# Patient Record
Sex: Female | Born: 1970
Health system: Southern US, Community
[De-identification: ages and names within clinical notes are randomized; demographics above are authoritative.]

## PROBLEM LIST (undated history)

## (undated) DIAGNOSIS — E079 Disorder of thyroid, unspecified: Secondary | ICD-10-CM

## (undated) DIAGNOSIS — E119 Type 2 diabetes mellitus without complications: Secondary | ICD-10-CM

## (undated) DIAGNOSIS — I10 Essential (primary) hypertension: Secondary | ICD-10-CM

## (undated) DIAGNOSIS — N83209 Unspecified ovarian cyst, unspecified side: Secondary | ICD-10-CM

## (undated) DIAGNOSIS — K219 Gastro-esophageal reflux disease without esophagitis: Secondary | ICD-10-CM

## (undated) HISTORY — PX: KNEE SURGERY: SHX244

## (undated) HISTORY — PX: CHOLECYSTECTOMY: SHX55

## (undated) HISTORY — PX: BREAST SURGERY: SHX581

## (undated) HISTORY — PX: THYROIDECTOMY: SHX17

## (undated) HISTORY — PX: ABDOMINAL HYSTERECTOMY: SHX81

## (undated) HISTORY — PX: ESOPHAGEAL DILATION: SHX303

---

## 2004-09-17 ENCOUNTER — Emergency Department (HOSPITAL_COMMUNITY): Admission: EM | Admit: 2004-09-17 | Discharge: 2004-09-17 | Payer: Self-pay | Admitting: Emergency Medicine

## 2008-04-22 ENCOUNTER — Emergency Department (HOSPITAL_COMMUNITY): Admission: EM | Admit: 2008-04-22 | Discharge: 2008-04-22 | Payer: Self-pay | Admitting: Emergency Medicine

## 2008-04-28 ENCOUNTER — Encounter (INDEPENDENT_AMBULATORY_CARE_PROVIDER_SITE_OTHER): Payer: Self-pay | Admitting: Obstetrics and Gynecology

## 2008-04-28 ENCOUNTER — Ambulatory Visit (HOSPITAL_COMMUNITY): Admission: AD | Admit: 2008-04-28 | Discharge: 2008-04-28 | Payer: Self-pay | Admitting: Obstetrics and Gynecology

## 2008-07-08 ENCOUNTER — Emergency Department (HOSPITAL_BASED_OUTPATIENT_CLINIC_OR_DEPARTMENT_OTHER): Admission: EM | Admit: 2008-07-08 | Discharge: 2008-07-08 | Payer: Self-pay | Admitting: Emergency Medicine

## 2008-08-31 ENCOUNTER — Emergency Department (HOSPITAL_BASED_OUTPATIENT_CLINIC_OR_DEPARTMENT_OTHER): Admission: EM | Admit: 2008-08-31 | Discharge: 2008-08-31 | Payer: Self-pay | Admitting: Emergency Medicine

## 2008-10-17 ENCOUNTER — Emergency Department (HOSPITAL_BASED_OUTPATIENT_CLINIC_OR_DEPARTMENT_OTHER): Admission: EM | Admit: 2008-10-17 | Discharge: 2008-10-17 | Payer: Self-pay | Admitting: Emergency Medicine

## 2008-10-23 ENCOUNTER — Emergency Department (HOSPITAL_BASED_OUTPATIENT_CLINIC_OR_DEPARTMENT_OTHER): Admission: EM | Admit: 2008-10-23 | Discharge: 2008-10-23 | Payer: Self-pay | Admitting: Emergency Medicine

## 2008-12-19 ENCOUNTER — Ambulatory Visit: Payer: Self-pay | Admitting: Diagnostic Radiology

## 2008-12-19 ENCOUNTER — Emergency Department (HOSPITAL_BASED_OUTPATIENT_CLINIC_OR_DEPARTMENT_OTHER): Admission: EM | Admit: 2008-12-19 | Discharge: 2008-12-19 | Payer: Self-pay | Admitting: Emergency Medicine

## 2009-04-12 ENCOUNTER — Inpatient Hospital Stay (HOSPITAL_COMMUNITY): Admission: AD | Admit: 2009-04-12 | Discharge: 2009-04-12 | Payer: Self-pay | Admitting: Obstetrics and Gynecology

## 2009-05-15 ENCOUNTER — Inpatient Hospital Stay (HOSPITAL_COMMUNITY): Admission: AD | Admit: 2009-05-15 | Discharge: 2009-05-15 | Payer: Self-pay | Admitting: Obstetrics and Gynecology

## 2009-06-28 ENCOUNTER — Ambulatory Visit (HOSPITAL_COMMUNITY): Admission: RE | Admit: 2009-06-28 | Discharge: 2009-06-29 | Payer: Self-pay | Admitting: Obstetrics and Gynecology

## 2009-06-28 ENCOUNTER — Encounter (INDEPENDENT_AMBULATORY_CARE_PROVIDER_SITE_OTHER): Payer: Self-pay | Admitting: Obstetrics and Gynecology

## 2009-07-12 ENCOUNTER — Inpatient Hospital Stay (HOSPITAL_COMMUNITY): Admission: AD | Admit: 2009-07-12 | Discharge: 2009-07-13 | Payer: Self-pay | Admitting: Obstetrics and Gynecology

## 2009-09-12 ENCOUNTER — Emergency Department (HOSPITAL_BASED_OUTPATIENT_CLINIC_OR_DEPARTMENT_OTHER): Admission: EM | Admit: 2009-09-12 | Discharge: 2009-09-12 | Payer: Self-pay | Admitting: Emergency Medicine

## 2009-12-08 ENCOUNTER — Observation Stay (HOSPITAL_COMMUNITY): Admission: EM | Admit: 2009-12-08 | Discharge: 2009-12-08 | Payer: Self-pay | Admitting: Emergency Medicine

## 2009-12-30 ENCOUNTER — Encounter: Payer: Self-pay | Admitting: Emergency Medicine

## 2009-12-30 ENCOUNTER — Ambulatory Visit: Payer: Self-pay | Admitting: Diagnostic Radiology

## 2009-12-30 ENCOUNTER — Inpatient Hospital Stay (HOSPITAL_COMMUNITY): Admission: AD | Admit: 2009-12-30 | Discharge: 2009-12-30 | Payer: Self-pay | Admitting: Obstetrics and Gynecology

## 2010-01-28 ENCOUNTER — Inpatient Hospital Stay (HOSPITAL_COMMUNITY): Admission: AD | Admit: 2010-01-28 | Discharge: 2010-01-28 | Payer: Self-pay | Admitting: Obstetrics and Gynecology

## 2010-02-01 ENCOUNTER — Observation Stay (HOSPITAL_COMMUNITY): Admission: AD | Admit: 2010-02-01 | Discharge: 2010-02-03 | Payer: Self-pay | Admitting: Internal Medicine

## 2010-02-26 ENCOUNTER — Ambulatory Visit (HOSPITAL_COMMUNITY): Admission: RE | Admit: 2010-02-26 | Discharge: 2010-02-26 | Payer: Self-pay | Admitting: Obstetrics and Gynecology

## 2011-03-09 LAB — CBC
MCV: 92.9 fL (ref 78.0–100.0)
RBC: 4.2 MIL/uL (ref 3.87–5.11)
WBC: 10.2 10*3/uL (ref 4.0–10.5)

## 2011-03-09 LAB — HEPATIC FUNCTION PANEL
Albumin: 4.3 g/dL (ref 3.5–5.2)
Alkaline Phosphatase: 50 U/L (ref 39–117)
Bilirubin, Direct: 0 mg/dL (ref 0.0–0.3)
Indirect Bilirubin: 0.9 mg/dL (ref 0.3–0.9)
Total Bilirubin: 0.9 mg/dL (ref 0.3–1.2)

## 2011-03-09 LAB — URINALYSIS, ROUTINE W REFLEX MICROSCOPIC
Glucose, UA: NEGATIVE mg/dL
Ketones, ur: NEGATIVE mg/dL
Nitrite: NEGATIVE
Protein, ur: NEGATIVE mg/dL
Urobilinogen, UA: 1 mg/dL (ref 0.0–1.0)
pH: 7 (ref 5.0–8.0)

## 2011-03-09 LAB — BASIC METABOLIC PANEL
Chloride: 104 mEq/L (ref 96–112)
Creatinine, Ser: 0.9 mg/dL (ref 0.4–1.2)
GFR calc Af Amer: >60 mL/min (ref >60)
Sodium: 138 mEq/L (ref 135–145)

## 2011-03-09 LAB — DIFFERENTIAL
Lymphs Abs: 3.4 10*3/uL (ref 0.7–4.0)
Monocytes Relative: 6 % (ref 3–12)
Neutro Abs: 5.9 10*3/uL (ref 1.7–7.7)
Neutrophils Relative %: 58 % (ref 43–77)

## 2011-03-09 LAB — LIPASE, BLOOD: Lipase: 37 U/L (ref 23–300)

## 2011-03-09 LAB — D-DIMER, QUANTITATIVE: D-Dimer, Quant: 0.31 ug/mL-FEU (ref 0.00–0.48)

## 2011-03-12 LAB — URINALYSIS, ROUTINE W REFLEX MICROSCOPIC
Bilirubin Urine: NEGATIVE
Hgb urine dipstick: NEGATIVE
Ketones, ur: NEGATIVE mg/dL
Nitrite: NEGATIVE
Specific Gravity, Urine: <1.005 — ABNORMAL LOW (ref 1.005–1.030)
Urobilinogen, UA: 0.2 mg/dL (ref 0.0–1.0)

## 2011-03-12 LAB — CBC
MCHC: 32.7 g/dL (ref 30.0–36.0)
MCV: 94.2 fL (ref 78.0–100.0)
RBC: 4.43 MIL/uL (ref 3.87–5.11)
RDW: 13.9 % (ref 11.5–15.5)

## 2011-03-12 LAB — DIFFERENTIAL
Basophils Absolute: 0 10*3/uL (ref 0.0–0.1)
Basophils Relative: 1 % (ref 0–1)
Eosinophils Absolute: 0 10*3/uL (ref 0.0–0.7)
Monocytes Absolute: 0.5 10*3/uL (ref 0.1–1.0)
Monocytes Relative: 5 % (ref 3–12)
Neutrophils Relative %: 64 % (ref 43–77)

## 2011-03-13 LAB — COMPREHENSIVE METABOLIC PANEL
AST: 94 U/L — ABNORMAL HIGH (ref 0–37)
Albumin: 3.5 g/dL (ref 3.5–5.2)
Albumin: 3.8 g/dL (ref 3.5–5.2)
Alkaline Phosphatase: 45 U/L (ref 39–117)
BUN: 10 mg/dL (ref 6–23)
CO2: 19 mEq/L (ref 19–32)
Calcium: 8.4 mg/dL (ref 8.4–10.5)
Chloride: 110 mEq/L (ref 96–112)
Creatinine, Ser: 1.01 mg/dL (ref 0.4–1.2)
GFR calc Af Amer: >60 mL/min (ref >60)
GFR calc non Af Amer: >60 mL/min (ref >60)
Glucose, Bld: 171 mg/dL — ABNORMAL HIGH (ref 70–99)
Potassium: 4.1 mEq/L (ref 3.5–5.1)
Total Bilirubin: 0.4 mg/dL (ref 0.3–1.2)
Total Protein: 6.3 g/dL (ref 6.0–8.3)

## 2011-03-13 LAB — URINALYSIS, ROUTINE W REFLEX MICROSCOPIC
Bilirubin Urine: NEGATIVE
Hgb urine dipstick: NEGATIVE
Nitrite: NEGATIVE
Protein, ur: NEGATIVE mg/dL
Urobilinogen, UA: 0.2 mg/dL (ref 0.0–1.0)

## 2011-03-13 LAB — CBC
MCHC: 33.3 g/dL (ref 30.0–36.0)
MCV: 93.7 fL (ref 78.0–100.0)
Platelets: 282 10*3/uL (ref 150–400)

## 2011-03-16 LAB — CBC
HCT: 39.1 % (ref 36.0–46.0)
MCHC: 33.4 g/dL (ref 30.0–36.0)
MCV: 94 fL (ref 78.0–100.0)
Platelets: 310 10*3/uL (ref 150–400)

## 2011-03-24 LAB — COMPREHENSIVE METABOLIC PANEL
AST: 164 U/L — ABNORMAL HIGH (ref 0–37)
Albumin: 4 g/dL (ref 3.5–5.2)
BUN: 2 mg/dL — ABNORMAL LOW (ref 6–23)
Chloride: 102 mEq/L (ref 96–112)
Creatinine, Ser: 0.96 mg/dL (ref 0.4–1.2)
GFR calc Af Amer: >60 mL/min (ref >60)
Potassium: 3.9 mEq/L (ref 3.5–5.1)
Total Bilirubin: 0.5 mg/dL (ref 0.3–1.2)
Total Protein: 6.7 g/dL (ref 6.0–8.3)

## 2011-03-24 LAB — DIFFERENTIAL
Eosinophils Relative: 0 % (ref 0–5)
Lymphocytes Relative: 8 % — ABNORMAL LOW (ref 12–46)
Monocytes Absolute: 0.5 10*3/uL (ref 0.1–1.0)
Monocytes Relative: 7 % (ref 3–12)
Neutro Abs: 6.2 10*3/uL (ref 1.7–7.7)

## 2011-03-24 LAB — CBC
HCT: 39.7 % (ref 36.0–46.0)
MCV: 93.2 fL (ref 78.0–100.0)
Platelets: 245 10*3/uL (ref 150–400)
RDW: 13.7 % (ref 11.5–15.5)
WBC: 7.4 10*3/uL (ref 4.0–10.5)

## 2011-03-24 LAB — HEPATIC FUNCTION PANEL
ALT: 174 U/L — ABNORMAL HIGH (ref 0–35)
AST: 152 U/L — ABNORMAL HIGH (ref 0–37)
Alkaline Phosphatase: 72 U/L (ref 39–117)
Bilirubin, Direct: <0.1 mg/dL (ref 0.0–0.3)
Total Bilirubin: 0.4 mg/dL (ref 0.3–1.2)

## 2011-03-28 LAB — URINALYSIS, ROUTINE W REFLEX MICROSCOPIC
Bilirubin Urine: NEGATIVE
Hgb urine dipstick: NEGATIVE
Ketones, ur: NEGATIVE mg/dL
Nitrite: NEGATIVE
Protein, ur: NEGATIVE mg/dL
Urobilinogen, UA: 1 mg/dL (ref 0.0–1.0)

## 2011-03-28 LAB — URINE MICROSCOPIC-ADD ON

## 2011-03-30 LAB — URINALYSIS, ROUTINE W REFLEX MICROSCOPIC
Glucose, UA: NEGATIVE mg/dL
Protein, ur: NEGATIVE mg/dL
Specific Gravity, Urine: 1.02 (ref 1.005–1.030)
pH: 6.5 (ref 5.0–8.0)

## 2011-03-30 LAB — COMPREHENSIVE METABOLIC PANEL
ALT: 23 U/L (ref 0–35)
Alkaline Phosphatase: 48 U/L (ref 39–117)
BUN: 10 mg/dL (ref 6–23)
CO2: 24 mEq/L (ref 19–32)
GFR calc non Af Amer: >60 mL/min (ref >60)
Glucose, Bld: 122 mg/dL — ABNORMAL HIGH (ref 70–99)
Potassium: 3.9 mEq/L (ref 3.5–5.1)
Sodium: 138 mEq/L (ref 135–145)

## 2011-03-30 LAB — CBC
HCT: 34.9 % — ABNORMAL LOW (ref 36.0–46.0)
Hemoglobin: 12.2 g/dL (ref 12.0–15.0)
Hemoglobin: 11.6 g/dL — ABNORMAL LOW (ref 12.0–15.0)
MCHC: 34.9 g/dL (ref 30.0–36.0)
Platelets: 264 10*3/uL (ref 150–400)
RBC: 3.78 MIL/uL — ABNORMAL LOW (ref 3.87–5.11)
RBC: 3.6 MIL/uL — ABNORMAL LOW (ref 3.87–5.11)
WBC: 11.1 10*3/uL — ABNORMAL HIGH (ref 4.0–10.5)
WBC: 13.6 10*3/uL — ABNORMAL HIGH (ref 4.0–10.5)

## 2011-03-30 LAB — URINE MICROSCOPIC-ADD ON

## 2011-03-30 LAB — HCG, SERUM, QUALITATIVE: Preg, Serum: NEGATIVE

## 2011-03-30 LAB — URINE CULTURE: Colony Count: 70000

## 2011-04-01 LAB — CBC
HCT: 40.4 % (ref 36.0–46.0)
Hemoglobin: 13.7 g/dL (ref 12.0–15.0)
MCV: 94.4 fL (ref 78.0–100.0)
RBC: 4.28 MIL/uL (ref 3.87–5.11)
WBC: 8.9 10*3/uL (ref 4.0–10.5)

## 2011-04-01 LAB — COMPREHENSIVE METABOLIC PANEL
AST: 23 U/L (ref 0–37)
BUN: 7 mg/dL (ref 6–23)
CO2: 25 mEq/L (ref 19–32)
Chloride: 105 mEq/L (ref 96–112)
Creatinine, Ser: 0.83 mg/dL (ref 0.4–1.2)
GFR calc Af Amer: >60 mL/min (ref >60)
GFR calc non Af Amer: >60 mL/min (ref >60)
Total Bilirubin: 0.8 mg/dL (ref 0.3–1.2)

## 2011-04-01 LAB — DIFFERENTIAL
Basophils Absolute: 0 10*3/uL (ref 0.0–0.1)
Eosinophils Relative: 0 % (ref 0–5)
Lymphocytes Relative: 18 % (ref 12–46)

## 2011-04-01 LAB — LIPASE, BLOOD: Lipase: 17 U/L (ref 11–59)

## 2011-04-02 LAB — URINALYSIS, ROUTINE W REFLEX MICROSCOPIC
Nitrite: NEGATIVE
Specific Gravity, Urine: 1.025 (ref 1.005–1.030)
pH: 5.5 (ref 5.0–8.0)

## 2011-04-02 LAB — DIFFERENTIAL
Basophils Absolute: 0.1 10*3/uL (ref 0.0–0.1)
Lymphocytes Relative: 41 % (ref 12–46)
Monocytes Absolute: 0.4 10*3/uL (ref 0.1–1.0)
Neutro Abs: 4.6 10*3/uL (ref 1.7–7.7)
Neutrophils Relative %: 53 % (ref 43–77)

## 2011-04-02 LAB — CBC
Hemoglobin: 13.5 g/dL (ref 12.0–15.0)
Platelets: 321 10*3/uL (ref 150–400)
RDW: 13.3 % (ref 11.5–15.5)

## 2011-05-06 NOTE — Op Note (Signed)
[name]                ACCOUNT NO.:  [id number]   MEDICAL RECORD NO.:  [id number]          PATIENT TYPE:  AMB   LOCATION:  SDC                           F[facility]ILITY:  [facility]   PHYSICIAN:  Osborn Coho, M.D.   DATE OF BIRTH:  09/02/1971   DATE OF PROCEDURE:  04/28/2008  DATE OF DISCHARGE:                               OPERATIVE REPORT   PREOPERATIVE DIAGNOSES:  1. Right ovarian cyst.  2. Ovarian torsion.   POSTOPERATIVE DIAGNOSES:  1. Right ovarian cyst.  2. Ovarian torsion.   PROCEDURE:  Laparoscopic right cystectomy.   ATTENDING PHYSICIAN:  Osborn Coho, MD   ANESTHESIA:  General via endotracheal tube.   FINDINGS:  Large approximately 7 cm right ovarian cyst.   SPECIMENS TO PATHOLOGY:  Right cyst wall and right cyst fluid.   FLUIDS:  1500 mL.   URINE OUTPUT:  30 mL.   ESTIMATED BLOOD LOSS:  Minimal.   COMPLICATIONS:  None.   PROCEDURE IN DETAIL:  The patient was taken to the operating room after  the risks, benefits, and alternatives discussed with the patient.  The  patient verbalized understanding and consent signed and witness.  The  patient was placed under general anesthesia and prepped and draped in  the normal sterile fashion in the dorsal lithotomy position.  A bivalved  speculum was placed in the patient's vagina and a Hulka tenaculum placed  for intrauterine manipulation.  The speculum was removed and attention  was then turned to the abdomen after regloving and gowning.  A 10-mm  incision was made at the umbilicus and Veress needle introduced into the  intra-abdominal cavity and pneumoperitoneum achieved.  The Veress needle  was removed and 10-mm trocar advanced to the intra-abdominal cavity and  laparoscope introduced as well.  Small amount of adhesions over the  liver edge and an adhesion in the left lower quadrant.  Cyst noted as  above.  A 5-mm incision was made in the left lower quadrant and 5-mm  trocar advanced under direct visualization.   0.5% Marcaine was injected  at all incision sites.  A 5-mm incision was also made in the suprapubic  region and 5-mm trocar advanced under direct visualization.  The cyst  was then placed above the uterus in the anterior cul-de-sac and using  the needle point gyrus, an incision was made over the cyst and the cyst  wall was shelled out.  The cyst was ruptured and fluid collected and  sent to pathology.  The cyst wall was easily pulled away from the normal  ovary and was sent to pathology.  The bed of the ovary was then made  hemostatic with the bipolar cautery.  Good hemostasis was noted.  Copious irrigation was performed.  The left ovary and fallopian tube  appeared to be within normal limits.  The pneumoperitoneum was then  relieved as the 5-mm trocar in the suprapubic region and in left lower  quadrant were removed under direct visualization.  The 10-mm trocar at  the umbilicus was then removed under direct visualization.  The 10-mm  incision at the umbilicus  was repaired on the fascia using 0 Vicryl via  a running stitch and the skin was reapproximated using 3-0 Monocryl via  a subcuticular stitch.  There was some bleeding noted at the left lower  quadrant incision which was made hemostatic by holding pressure.  The  two 5-mm incisions were repaired with Dermabond.  The Hulka tenaculum  was removed.  Sponge, lap, and needle count was correct.  The patient  tolerated the procedure well and is awaiting transfer to the recovery  room in good condition.      Osborn Coho, M.D.  Electronically Signed     AR/MEDQ  D:  04/28/2008  T:  04/29/2008  Job:  [id number]

## 2011-05-06 NOTE — Consult Note (Signed)
NAMEAZENETH, CARBONELL NO.:  [id number]   MEDICAL RECORD NO.:  [id number]          PATIENT TYPE:  MAT   LOCATION:  MATC                          [facility]:  [facility]   PHYSICIAN:  Janine Limbo, M.D.DATE OF BIRTH:  [birth year]   DATE OF CONSULTATION:  07/13/2009  DATE OF DISCHARGE:  07/13/2009                                 CONSULTATION   HISTORY OF PRESENT ILLNESS:  Ms. Forgione a is a 40 year old female, para  2-0-0-2, who presents to the Maternity Admissions Area at the [facility] complaining of abdominal pain.  She rates her  abdominal pain as a 9/10.  The patient had a laparoscopy-assisted  vaginal hysterectomy on June 28, 2009.  Her preoperative diagnosis was  dysmenorrhea and menorrhagia.  The patient also had cystoscopy  performed.  Operative findings included adhesions between the omentum  and the anterior abdominal wall.  The ovaries and the fallopian tubes  were normal bilaterally except for defects from a prior tubal ligation.  No pathology was noted on the uterus.  On cystoscopy, no pathology was  noted inside the bladder.  Dye easily passed through both ureteral  orifices.  The patient was discharged to home without complications in  her postoperative stay.  The patient reports tonight that her pain never  improved much with pain medicine.  Even though at one point she says she  was allergic to DILAUDID, she has been taking Dilaudid at home.  She  reports that today her pain got worse than it normally has been.  The  patient did not have a bowel movement today, but had did have a bowel  movement yesterday.  She denies rectal bleeding.  She reports that she  has had some dysuria.  The patient reports that she had pain prior to  her surgery.  She actually rated her pain prior to her surgery as 10/10.  The patient has been followed at the [facility] and  Gynecology Division of [facility].  The patient  has  had a prior tubal ligation and she also had a cesarean section.   DRUG ALLERGIES:  The patient reports me in allergic to DARVOCET,  PERCOCET, and ZOFRAN.  She says she is allergic to DILAUDID, but this  causes chest pain.   PAST MEDICAL HISTORY:  The patient has a history of gastroesophageal  reflux disease, irritable bowel syndrome, migraine headaches, ovarian  torsion, and abnormal thyroid testing.  She had a cholecystectomy done  in 2000.  She had a right breast biopsy performed in 2000.  The patient  had a laparoscopic right ovarian cystectomy in 2009.  She had a meniscus  repair in her left knee in 2004.   SOCIAL HISTORY:  The patient drinks alcohol socially.  She denies  tobacco use and other recreational drug uses.   REVIEW OF SYSTEMS:  Please see history of present illness.   FAMILY HISTORY:  Noncontributory.   PHYSICAL EXAMINATION:  VITAL SIGNS:  Blood pressure is 122/59, pulse is  91, respirations 20, temperature is 98.6.  HEENT:  Within normal limits.  The patient expresses that her pain is  9/10; however, she is in no distress when you do her abdominal exam.  CHEST:  Clear.  HEART:  Regular rate and rhythm.  BACK:  Without CVA tenderness.  ABDOMEN:  Appropriately tender, although she says her pain is 9/10.  Her  incisions are well healed.  PELVIC:  External genitalia is normal.  The vagina is normal.  The  bladder is nontender to exam.  The rectum is nontender to exam.  The  cervix is absent.  Uterus is absent.  No adnexal masses are appreciated.   LABORATORY DATA:  White blood cell count is 9100, hematocrit is 34.9%  hemoglobin is 12.2, platelet count is 328,000.  Urinalysis is normal  except for large amount of hemoglobin in the urine and a large amount of  leukocyte Estrace.   ASSESSMENT:  1. Abdominal pain of uncertain etiology.  2. Status post laparoscopy-assisted vaginal hysterectomy and      cystectomy on June 28, 2009.  3. History of irritable bowel  syndrome.  4. Possible urinary tract infection.   PLAN:  We will obtain an ultrasound of the pelvis while the patient is  here in the maternity admissions.  We will give her 60 mg of Toradol  intramuscularly and 25 mg of Phenergan intramuscularly.  We will  discharge the patient to home if no pathology is appreciated.  We will  send her urine for culture.  We will treat the patient with Septra DS  one tablet twice a day for 7 days.  Please make a copy of this note per  Dr. Osborn Coho at the [facility] and Gynecology  Division of [facility].      Janine Limbo, M.D.  Electronically Signed     AVS/MEDQ  D:  07/13/2009  T:  07/13/2009  Job:  [id number]

## 2011-05-06 NOTE — Op Note (Signed)
[name], [name]                ACCOUNT NO.:  [id number]   MEDICAL RECORD NO.:  [id number]          PATIENT TYPE:  OIB   LOCATION:  9317                          [facility]ACILITY:  [facility]   PHYSICIAN:  Osborn Coho, M.D.   DATE OF BIRTH:  Mar 28, 1971   DATE OF PROCEDURE:  06/28/2009  DATE OF DISCHARGE:                               OPERATIVE REPORT   PREOPERATIVE DIAGNOSES:  1. Dysmenorrhea.  2. Menorrhagia.   POSTOPERATIVE DIAGNOSES:  1. Dysmenorrhea.  2. Menorrhagia.  3. Adhesions.   PROCEDURE:  1. Laparoscopically-assisted vaginal hysterectomy.  2. Cystoscopy.   ATTENDING DOCTOR:  Osborn Coho, MD   ASSISTANT:  Marquis Lunch. Lowell Guitar, [facility]   ANESTHESIA:  General.   SPECIMENS TO PATHOLOGY:  Uterus and cervix weighing 145 g.   FLUIDS:  3700 mL.   URINE OUTPUT:  800 mL.   ESTIMATED BLOOD LOSS:  200 mL.   COMPLICATIONS:  None.   PROCEDURE:  The patient was taken to the operating room after the risks,  benefits, and alternatives discussed with the patient.  The patient  verbalized understanding, consent signed, and witnessed.  The patient  was placed under general per anesthesia and prepped and draped in the  normal sterile fashion in the dorsal lithotomy position.  A 0.25%  Marcaine was injected at the site of the umbilicus using a total of  approximately 10 mL.  A 10-mm incision was made at the umbilicus and the  Veress needle introduced into the intra-abdominal cavity and  pneumoperitoneum achieved.  The 10-mm trocar was then advanced into the  intra-abdominal cavity and laparoscope introduced.  Adhesions noted in  the upper abdominal cavity of the omentum to the anterior abdominal  wall.  The patient was placed in Trendelenburg.  Attention was then  turned to left lower quadrant, where a 5-mm incision was made and 5-mm  trocar advanced under direct visualization.  The same was done in the  right lower quadrant.  The bilateral ovaries were noted to be within  normal limits  and the bilateral fallopian tubes were noted to be within  normal limits as well with the history of the tubal ligation.  Attention  was then turned to the left round ligament which was cauterized and cut  and bladder flap created on that side.  The fallopian tube and utero-  ovarian were cauterized and cut down to the level of the incision on the  round ligament.  The same was done on the contralateral side.  I then  went below and a weighted speculum was placed in the patient's vagina  and the Deaver retractors were placed for vaginal wall retraction.  The  cervix was injected with dilute Pitressin and the cervix was  circumscribed with the Bovie.  The bladder was pushed away and the  anterior cul-de-sac entered without difficulty.  The posterior cul-de-  sac was then entered without difficulty as well.  The curved Heaney was  used to clamp, cut, and suture-ligated with 0 Vicryl of the uterosacrals  bilaterally.  In a bilateral sequential fashion, the paracervical tissue  and cord corporating  the cardinal ligaments was then clamped, cut, and  suture-ligated with 0 Vicryl.  This was done on the parametrial tissue  bilaterally and sequentially up to the level of where the utero-ovarian  pedicles were taken down and the round ligament was incised from above.  The uterus was then removed without difficulty and a free needle was  used to ligate the angles while pulling the suture through from the  angle stitches.  The McCall culdoplasty stitch was placed and the  vaginal cuff was repaired with 0 Vicryl incorporating the peritoneum.  The cuff was repaired with 0 Vicryl via figure-of-eight stitches.  There  was good hemostasis noted and the McCall culdoplasty stitch was tied.  I  then removed my outer gloves and went up above once again and  laparoscope introduced and the ovarian pedicles were noted bilaterally  to be hemostatic.  There was very small amount of oozing at the midline  of the  vaginal cuff and Gelfoam was placed for hemostasis.  Pneumoperitoneum was relieved for at least a minute and pneumoperitoneum  was achieved once again and the vaginal cuff was reinspected and good  hemostasis was noted.  The bilateral left and right lower quadrant  trocars were removed under direct visualization and pneumoperitoneum was  relieved and umbilical trocar removed under direct visualization.  The  fascia was repaired with 0 Vicryl via figure-of-eight stitch at the  umbilicus and the skin was reapproximated using 3-0 Monocryl via a  subcuticular stitch.  Dermabond was placed on the 5-mm left and right  lower quadrant ports.  Indigo carmine was administered and cystoscopy  was performed after prepping with Betadine and the bilateral ureters  were noted to efflux the indigo carmine without difficulty.  Sponge,  lap, and needle count was correct.  The patient tolerated the procedure  well and was returned to the recovery room in good condition.      Osborn Coho, M.D.  Electronically Signed     AR/MEDQ  D:  06/28/2009  T:  06/28/2009  Job:  [id number]

## 2011-05-06 NOTE — H&P (Signed)
[name], GROUND NO.:  [id number]   MEDICAL RECORD NO.:  [id number]          PATIENT TYPE:  AMB   LOCATION:                                [facility]   PHYSICIAN:  Osborn Coho, M.D.   DATE OF BIRTH:  02-Jul-1971   DATE OF ADMISSION:  06/28/2009  DATE OF DISCHARGE:                              HISTORY & PHYSICAL   HISTORY OF PRESENT ILLNESS:  [name] is a 40 year old married African  American female para 2-0-0-2 presenting for laparoscopically-assisted  vaginal hysterectomy because of menorrhagia and dysmenorrhea.  For the  past year and a half, the patient reports that her menstrual periods  have become increasingly more frequent, heavier, and painful.  She  reports twice monthly bleeding, lasting 5-7 days each episode during  which time she changes her pad every 2-4 hours.  She also admits to  clotting and will cramp as much as 1 week before she begins her flow and  rating that pain as a 10/10 on a 10-point pain scale.  The patient has  no relief from taking Tylenol or Vicodin for this pain and occasionally  will have decrease from 10/10 on a 10-point pain scale to 9/10 on a 10-  point pain scale with ibuprofen 600 mg.  It is interesting to note that  the patient states that when she does take her Tylenol that her pain  actually increases.  She admits to occasional constipation, but denies  any dyspareunia, urinary tract symptoms, vaginitis symptoms, or fever.  In May 2010, the patient presented with complaints of severe pelvic pain  and was evaluated with the findings of a normal comprehensive metabolic  panel, amylase, lipase, and CBC.  The patient underwent a CT scan of her  abdomen and pelvis, which also returned normal.  A pelvic ultrasound at  that same time showed a uterus measuring 4.79 x 8.93 x 5.67 cm with the  right ovary measuring 1.80 x 2.96 x 3.17 cm and the left ovary measuring  1.81 x 8.96 x 2.72 cm.  The patient's endometrium  appeared normal on  that study.  After careful consideration of both medical and surgical  management options for her symptoms, the patient has decided to proceed  with definitive therapy in the form of hysterectomy.   PAST MEDICAL HISTORY:   OBSTETRICS HISTORY:  Gravida 2, para 2-0-0-2.  The patient had one  spontaneous vaginal birth in 1997 and a cesarean section in 2003.   GYNECOLOGIC HISTORY:  Menarche at 40 years old.  Last menstrual period,  June 05, 2009.  She has used bilateral tubal ligation as her method of  contraception.  She denies any history of sexually transmitted diseases  and she denies any history of abnormal Pap smears.  Her last normal Pap  smear was May 2009.   MEDICAL HISTORY:  Gastroesophageal reflux disease, irritable bowel  syndrome, migraine headaches, abnormal thyroid test, and ovarian  torsion.   SURGICAL HISTORY:  In 2000, cholecystectomy; 2000, right breast biopsy  benign findings; 2003, bilateral tubal ligation; 2004, endometrial  ablation; 2004, left  knee meniscus repair; 2009, laparoscopic right  ovarian cystectomy.  Denies any problems with anesthesia or any history  of blood transfusion.   FAMILY HISTORY:  Hypertension, diabetes mellitus, thyroid disease, and  breast cancer.   HABITS:  The patient does not use any tobacco.  She occasionally  consumes alcohol.  Denies any illicit drug use.  The patient is married  and she works for [employer].   CURRENT MEDICATIONS:  1. Ambien 10 mg as needed.  2. Tylenol Extra Strength 2 tablets every 4 hours as needed.  3. Topamax as needed.   ALLERGIES:  The patient is allergic to DARVOCET and PERCOCET, both of  which causes hives and GI upset.  She also is allergic to DILAUDID,  which causes chest pain (the patient may, however, take Vicodin though  she states this does nothing for her pain).  She denies any sensitivity  to latex or to shellfish.   REVIEW OF SYSTEMS:  The patient  denies any chest pain, shortness of  breath, headache, vision changes, nausea, vomiting, diarrhea, skin  rashes, joint or muscle pain and except as is mentioned in history of  present illness, the patient's review of systems is otherwise negative.   PHYSICAL EXAMINATION:  VITAL SIGNS:  Blood pressure 112/76, respirations  14, pulse 76, temperature 97.8 degrees Fahrenheit orally, weight 184  pounds, height 5 feet 2 inches tall.  GENERAL:  Neck is supple without masses.  NECK:  There is no thyromegaly or cervical adenopathy.  HEART:  Regular rate and rhythm.  LUNGS:  Clear.  BACK:  No CVA tenderness.  ABDOMEN:  Does have mid lower pelvic tenderness.  No guarding, no  rebound.  There are no masses or organomegaly.  EXTREMITIES:  No clubbing, cyanosis, or edema.  PELVIC:  EGBUS is within normal limits.  Vagina is rugous.  Cervix is  nontender without lesions.  Uterus appears upper limits of normal size,  is tender, and it was sound to 8 cm.  Adnexa, no tenderness or masses.  The patient underwent an endometrial biopsy at her preoperative visit  and the results are pending.   IMPRESSION:  1. Dysmenorrhea.  2. Menorrhagia.   DISPOSITION:  A discussion was held with the patient regarding the  indications for her procedure along with its risks which include, but  are not limited to reaction to anesthesia, damage to adjacent organs,  infection, excessive bleeding, and risk of early ovarian failure.  The  patient further understands that there may be a possibility that she may  require an open abdominal incision to complete her surgery safely and  she has consented to proceed with laparoscopically-assisted vaginal  hysterectomy with the possibility of a total abdominal hysterectomy at  [facility] [place] on June 28, 2009, at 9 o'clock a.m.      [name].      Osborn Coho, M.D.  Electronically Signed    EJP/MEDQ  D:  06/21/2009  T:  06/22/2009  Job:   [id number]

## 2011-05-06 NOTE — H&P (Signed)
[name], Natalie NO.:  [id number]   MEDICAL RECORD NO.:  [id number]          PATIENT TYPE:  AMB   LOCATION:  SDC                           FACILITY:  [facility]   PHYSICIAN:  Osborn Coho, M.D.   DATE OF BIRTH:  1970-12-28   DATE OF ADMISSION:  04/28/2008  DATE OF DISCHARGE:                              HISTORY & PHYSICAL   HISTORY OF PRESENT ILLNESS:  [name] is a 40 year old married African-  American female, who is status post bilateral tubal ligation, para 2-0-0-  2, presenting for laparoscopic ovarian cystectomy with the possibility  of oophorectomy.  The patient was seen at [facility] Emergency Department on  Apr 22, 2008, for extreme pelvic pain at which time she was diagnosed  with bilateral ovarian cyst.  Then, her ultrasound revealed a right  ovarian cyst measuring 6.2 x 4.5 x 5.6 cm and a left ovarian cyst (felt  to be the corpus luteum) measuring 2.5 x 2.3 x 2.2 cm.  The patient  presented to [facility] on Apr 26, 2008, with persistent  pain which she rated at a 10/10 on a 10-point pain scale and worsened  whenever she would cough, laugh, or sneeze.  The patient went on to  report that the pain was constant and without any relief from any change  in position or medications she may have taken.  Additionally, she  reported having had a temperature of 102.6 degrees Fahrenheit orally.  Two days before, she was seen in the office; however, at the time of her  visit, her temperature was 99.1 degrees Fahrenheit orally and her  complete blood count was within normal limits.  The patient was given  Ultram and extended release Naprelan for her pain; however, three days  later, she returned after enduring approximately 12 hours of persistent  intense pain which was unresponsive to either of these analgesics.  A  repeat ultrasound on Apr 26, 2008, showed a complex right ovarian cyst  measuring 7.0 x 7.7 x 6.78 cm with no blood flow.  Additionally, no free  fluid was seen and her left ovary and uterus appeared within normal  limits.  Given the patient's relentless pelvic pain and ultrasound  findings, she was admitted for possible ovarian torsion and surgical  intervention.   PAST MEDICAL HISTORY:   OB HISTORY:  Gravida 2, para 2-0-0-2.   GYN HISTORY:  Menarche 40 years old.  The patient's last menstrual  period was Apr 25, 2008.  (Previous menstrual period was April 03, 2008).  The patient uses bilateral tubal ligation as a method of contraception.  The patient's periods are long lasting for proximally 9 days ago, during  which time she changes a pad every 2 hours accompanied by severe  cramping.  She denies any history of sexually transmitted diseases or  any history of abnormal Pap smears.   MEDICAL HISTORY:  Negative.   SURGICAL HISTORY:  Bilateral tubal ligation and endometrial ablation,  cholecystectomy, right knee surgery, and dental surgery.  The patient  denies any problems with anesthesia.   FAMILY HISTORY:  Thyroid  disease, breast cancer (maternal aunt at age  32).   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   HABITS:  The patient does not use tobacco.  She consumes alcohol on  occasion and denies any use of illicit drugs.   CURRENT MEDICATIONS:  1. Extra strength Tylenol as directed.  2. Ultram 1-2 tablets every 4 hours as needed for pain.  3. Naprelan extended release 375 mg two tablets once daily.  4. Zolpidem 10 mg at bedtime as needed.   ALLERGIES:  The patient is allergic to DARVOCET, PERCOCET, VICODIN, and  COMPAZINE, all of which causes hives.   REVIEW OF SYSTEMS:  The patient denies any chest pain, shortness of  breath, nausea, vomiting, diarrhea, and except as mentioned in history  of present illness, the patient's review of systems is negative.   PHYSICAL EXAMINATION:  VITAL SIGNS:  Blood pressure 130/98, weight is  181-1/2  pounds, temperature of 99.5 degrees Fahrenheit orally.  GENERAL:  Back is without CVA tenderness.  ABDOMEN:  There is diffuse tenderness especially in both lower quadrants  with guarding; however, there is no rebound or organomegaly.  PELVIC:  EGBUS is normal.  Vagina is normal, though the patient does  have a small amount of blood in the vaginal vault.  Cervix is tender.  There are no lesions.  Uterus is tender, difficult to assess size,  shape, and consistency due to body habitus, the patient's discomfort;  however, it appears upper limits of normal size.  Adnexa without any  distinct masses, though it is tender bilaterally and feels fuller in the  right adnexa compared with the left.   IMPRESSION:  1. Probable ovarian torsion.  2. Pelvic pain.   DISPOSITION:  A discussion was held with the patient regarding the  indications for her procedure.  Along with its risks would include but  are not limited to reaction to anesthesia, damage to adjacent organs,  infection, excessive bleeding, and the possibility of the need for an  oophorectomy.  The patient verbalized understanding of these risks and  has consented to proceed with laparoscopic ovarian cystectomy with the  possibility of oophorectomy at [facility] [place].      [name].      Osborn Coho, M.D.  Electronically Signed    EJP/MEDQ  D:  04/28/2008  T:  04/29/2008  Job:  [id number]

## 2011-07-11 ENCOUNTER — Emergency Department (INDEPENDENT_AMBULATORY_CARE_PROVIDER_SITE_OTHER): Payer: Worker's Compensation

## 2011-07-11 ENCOUNTER — Emergency Department (HOSPITAL_BASED_OUTPATIENT_CLINIC_OR_DEPARTMENT_OTHER)
Admission: EM | Admit: 2011-07-11 | Discharge: 2011-07-11 | Disposition: A | Discharge status: Home / Self Care | Payer: Worker's Compensation | Attending: Emergency Medicine | Admitting: Emergency Medicine

## 2011-07-11 ENCOUNTER — Encounter: Payer: Self-pay | Admitting: *Deleted

## 2011-07-11 DIAGNOSIS — M7989 Other specified soft tissue disorders: Secondary | ICD-10-CM | POA: Insufficient documentation

## 2011-07-11 DIAGNOSIS — W19XXXA Unspecified fall, initial encounter: Secondary | ICD-10-CM

## 2011-07-11 DIAGNOSIS — M7918 Myalgia, other site: Secondary | ICD-10-CM

## 2011-07-11 DIAGNOSIS — R0789 Other chest pain: Secondary | ICD-10-CM

## 2011-07-11 DIAGNOSIS — S39012A Strain of muscle, fascia and tendon of lower back, initial encounter: Secondary | ICD-10-CM

## 2011-07-11 DIAGNOSIS — M25579 Pain in unspecified ankle and joints of unspecified foot: Secondary | ICD-10-CM

## 2011-07-11 DIAGNOSIS — S93409A Sprain of unspecified ligament of unspecified ankle, initial encounter: Secondary | ICD-10-CM

## 2011-07-11 DIAGNOSIS — M545 Low back pain, unspecified: Secondary | ICD-10-CM

## 2011-07-11 DIAGNOSIS — S335XXA Sprain of ligaments of lumbar spine, initial encounter: Secondary | ICD-10-CM | POA: Insufficient documentation

## 2011-07-11 DIAGNOSIS — R609 Edema, unspecified: Secondary | ICD-10-CM

## 2011-07-11 DIAGNOSIS — W010XXA Fall on same level from slipping, tripping and stumbling without subsequent striking against object, initial encounter: Secondary | ICD-10-CM | POA: Insufficient documentation

## 2011-07-11 HISTORY — DX: Essential (primary) hypertension: I10

## 2011-07-11 HISTORY — DX: Gastro-esophageal reflux disease without esophagitis: K21.9

## 2011-07-11 MED ORDER — BUTORPHANOL TARTRATE 10 MG/ML NA SOLN
1.0000 | NASAL | Status: AC | PRN
Start: 2011-07-11 — End: 2011-07-25

## 2011-07-11 MED ORDER — DIAZEPAM 5 MG PO TABS
10.0000 mg | ORAL_TABLET | Freq: Once | ORAL | Status: AC
  Administered 2011-07-11: 10 mg via ORAL
  Filled 2011-07-11: qty 2

## 2011-07-11 MED ORDER — KETOROLAC TROMETHAMINE 30 MG/ML IJ SOLN
30.0000 mg | Freq: Once | INTRAMUSCULAR | Status: AC
  Administered 2011-07-11: 30 mg via INTRAMUSCULAR
  Filled 2011-07-11: qty 1

## 2011-07-11 MED ORDER — DIAZEPAM 5 MG PO TABS: 5.0000 mg | ORAL_TABLET | Freq: Two times a day (BID) | ORAL | Status: AC

## 2011-07-11 NOTE — ED Provider Notes (Signed)
History    History of Present Illness   Patient Identification [name] is a 40 y.o. female.  Patient information was obtained from patient. History/Exam limitations: none. Patient presented to the Emergency Department ambulatory.  Chief Complaint  Fall   Patient is here for evaluation after a fall. Patient reportedly was walking when she fell from standing. The accident occurred 1 day ago. It is reported that the patient did not have LOC and was ambulatory on scene. At this time she complains of low back pain, chest pain and lower extremity injury.  Patient denies headache, visual change, neck pain, nausea, vomiting, numbness, tingling, incontinence and inability to ambulate. Symptoms are exacerbated by movement, use of injured area and pressure on injured area. The patient has tried rest for her symptoms with no relief. Pt states there was a wet spot on the floor and she slipped, falling onto her right buttock/low back.  Most pain in low back, right rib cage/chest wall, and right ankle.  Pt does also c/o diffuse pain and body aches.  Pt also states the only pain medication she can tolerate is stadol.  States she did not strike head.  Past Medical History  Diagnosis Date  . Hypertension   . GERD (gastroesophageal reflux disease)    No family history on file. Current Facility-Administered Medications  Medication Dose Route Frequency Provider Last Rate Last Dose  . ketorolac (TORADOL) injection 30 mg  30 mg Intramuscular Once Ethelda Chick, MD   30 mg at 07/11/11 1441   Current Outpatient Prescriptions  Medication Sig Dispense Refill  . Esomeprazole Magnesium (NEXIUM PO) Take by mouth.         Allergies  Allergen Reactions  . Darvocet (Propoxyphene N-Acetaminophen)   . Percocet (Oxycodone-Acetaminophen)   . Vicodin (Hydrocodone-Acetaminophen)    History   Social History  . Marital Status: Married    Spouse Name: N/A    Number of Children: N/A  . Years of Education:  N/A   Occupational History  . Not on file.   Social History Main Topics  . Smoking status: Never Smoker   . Smokeless tobacco: Not on file  . Alcohol Use: Yes     wine  . Drug Use: No  . Sexually Active:    Other Topics Concern  . Not on file   Social History Narrative  . No narrative on file   Review of Systems Pertinent items are noted in HPI.  10 Systems reviewed and are negative for acute change except as noted in the HPI. Physical Exam   BP 146/90  Pulse 99  Temp(Src) 98.6 F (37 C) (Oral)  Resp 20  SpO2 99% Glasgow Coma Score Eye opening: 4 - Opens eyes on own  Verbal:  5 - Alert and oriented  Motor:  6 - Follows simple motor commands  GCS Total: 15   BP 146/90  Pulse 99  Temp(Src) 98.6 F (37 C) (Oral)  Resp 20  SpO2 99% General appearance: alert, cooperative and uncomfortable appearing Head: Normocephalic, without obvious abnormality, atraumatic Eyes: negative, perrl, eomi Face: no midline facial tenderness or instability Neck: no midline cspine or paraspinal tenderness, FROM supple Back: no skin lesions, erythema, or scars, ttp over lumbar spine and right paraspinal lumbar region, no ttp over thoracic midline, no CVA tenderness Lungs: clear to auscultation bilaterally Chest: ttp over right thoracic cage at midaxillary line, no ttp over sternum, no crepitus, movement normal Heart: regular rate and rhythm, S1, S2 normal, no  murmur, click, rub or gallop Abdomen: soft, non-tender; bowel sounds normal; no masses,  no organomegaly Extremities: extremities normal, atraumatic, no cyanosis or edema and except for diffuse ttp over large muscle groups of lower extremiity, tpp over lateral malleolus of right ankle- no deformity, FROM of all extremities Pulses: 2+ and symmetric Skin: Skin color, texture, turgor normal. No rashes or lesions, no abrasions or contusions or lacerations Neurologic: Grossly normal, strength, sensation intact in extremities, gait and  speech normal Psych: tearful, cooperative  ED Course   Studies:   Dg Ribs Unilateral W/chest Right  07/11/2011  *RADIOLOGY REPORT*  Clinical Data: Larey Seat yesterday, now with lower right-sided anterior rib pain.  RIGHT RIBS AND CHEST - 3+ VIEW  Comparison: Chest CTA - 01 09/10/2010; PA and lateral chest radiograph - 12/30/2009  Findings:  Unchanged cardiac silhouette and mediastinal contours.  No focal parenchymal opacities.  No pleural effusion or pneumothorax.  No displaced right-sided rib fractures.  Post cholecystectomy.  IMPRESSION: 1.  No acute cardiopulmonary disease.  2.  No displaced rib fractures.  Original Report Authenticated By: [id number]   Dg Lumbar Spine Complete  07/11/2011  *RADIOLOGY REPORT*  Clinical Data: Fall yesterday, now with low back pain.  LUMBAR SPINE - COMPLETE 4+ VIEW  Comparison: Abdominal CT - 12/30/2009  Findings: There are 5 non-rib bearing lumbar type vertebral bodies.  Normal alignment without anterolithesis or retrolithesis.  There is unchanged minimal (25%) anterior compression deformity of the T12 vertebral body.  Otherwise, vertebral body heights are maintained. No pars defects.  Intervertebral disc spaces are preserved.  Limited visualization of the bilateral SI joints and hips is normal.  Visualized bowel gas pattern is normal. Post cholecystectomy.  IMPRESSION: No acute findings.  Original Report Authenticated By: [id number]   Dg Ankle Complete Right  07/11/2011  *RADIOLOGY REPORT*  Clinical Data: Post fall yesterday, now with ankle pain and swelling.  RIGHT ANKLE - COMPLETE 3+ VIEW  Comparison: None.  Findings: No fracture or dislocation.  Ankle mortise is preserved. Joint spaces are preserved.  No joint effusion. Minimal soft tissue swelling about the lateral malleolus.  IMPRESSION: No fracture.  Original Report Authenticated By: [id number]      Records Reviewed: Old medical records. Nursing notes. Current xrays reviewed by me as well  Treatments: d/w patient her  reactions to different pain medications, she states the only one she can take is stadol- I informed her that stadol is on national back order and not available.  She agreed to try Toradol IM.  Will reassess 4:11 PM- d/w patient her results.  She is requesting workman's comp paperwork to be filled out.  I recommended that she follow up with her PMD and with either [facility] occupational medicine or [facility] occupational health.  Will try valium for muscle relaxer and patient requesting intranasal stadol prescription.   Disposition: Home Muscle relaxants  Chief Complaint  Patient presents with  . Fall   HPI  Past Medical History  Diagnosis Date  . Hypertension   . GERD (gastroesophageal reflux disease)     Past Surgical History  Procedure Date  . Abdominal hysterectomy   . Cholecystectomy   . Breast surgery   . Cesarean section   . Knee surgery     No family history on file.  History  Substance Use Topics  . Smoking status: Never Smoker   . Smokeless tobacco: Not on file  . Alcohol Use: Yes     wine    OB History  Grav Para Term Preterm Abortions TAB SAB Ect Mult Living                  Review of Systems  Physical Exam  BP 146/90  Pulse 99  Temp(Src) 98.6 F (37 C) (Oral)  Resp 20  SpO2 99%  Physical Exam  ED Course  Procedures  MDM Pt with diffuse body pain after fall, xrays without focal abnormality.  Discharged with strict return precautions.  Pt agreeable with plan.

## 2011-07-11 NOTE — ED Notes (Signed)
Slipped on wet floor yesterday. Landed on her back. Today she is having pain in her diaphragm with breathing and fells sob. Right ankle pain. Pain between her neck and upper back.

## 2011-09-19 LAB — CBC
HCT: 40.7
Hemoglobin: 13.8
RDW: 13.1

## 2011-09-19 LAB — DIFFERENTIAL
Basophils Absolute: 0.1
Basophils Relative: 1
Neutro Abs: 3.9
Neutrophils Relative %: 54

## 2011-09-19 LAB — LIPASE, BLOOD: Lipase: 29

## 2011-09-19 LAB — URINALYSIS, ROUTINE W REFLEX MICROSCOPIC
Ketones, ur: NEGATIVE
Nitrite: NEGATIVE
Protein, ur: NEGATIVE

## 2011-09-19 LAB — COMPREHENSIVE METABOLIC PANEL
Alkaline Phosphatase: 45
BUN: 11
Glucose, Bld: 95
Potassium: 4.6
Total Bilirubin: 0.3
Total Protein: 6.8

## 2011-09-19 LAB — PREGNANCY, URINE: Preg Test, Ur: NEGATIVE

## 2011-09-24 LAB — CBC
Platelets: 339
RDW: 12.8

## 2011-09-24 LAB — URINALYSIS, ROUTINE W REFLEX MICROSCOPIC
Bilirubin Urine: NEGATIVE
Nitrite: NEGATIVE
Protein, ur: NEGATIVE
Specific Gravity, Urine: 1.019
Urobilinogen, UA: 0.2

## 2011-09-24 LAB — DIFFERENTIAL
Basophils Absolute: 0.1
Basophils Relative: 1
Eosinophils Relative: 1
Lymphocytes Relative: 30
Neutro Abs: 6.5

## 2011-09-24 LAB — WET PREP, GENITAL: Trich, Wet Prep: NONE SEEN

## 2011-09-24 LAB — PREGNANCY, URINE: Preg Test, Ur: NEGATIVE

## 2011-09-24 LAB — GC/CHLAMYDIA PROBE AMP, GENITAL: Chlamydia, DNA Probe: NEGATIVE

## 2011-09-26 LAB — URINALYSIS, ROUTINE W REFLEX MICROSCOPIC
Bilirubin Urine: NEGATIVE
Nitrite: NEGATIVE
Specific Gravity, Urine: 1.018 (ref 1.005–1.030)
Urobilinogen, UA: 0.2 mg/dL (ref 0.0–1.0)
pH: 7 (ref 5.0–8.0)

## 2011-09-26 LAB — PREGNANCY, URINE: Preg Test, Ur: NEGATIVE

## 2011-10-24 ENCOUNTER — Emergency Department (HOSPITAL_BASED_OUTPATIENT_CLINIC_OR_DEPARTMENT_OTHER)
Admission: EM | Admit: 2011-10-24 | Discharge: 2011-10-24 | Disposition: A | Discharge status: Home / Self Care | Payer: BC Managed Care – PPO | Attending: Emergency Medicine | Admitting: Emergency Medicine

## 2011-10-24 ENCOUNTER — Emergency Department (INDEPENDENT_AMBULATORY_CARE_PROVIDER_SITE_OTHER): Payer: BC Managed Care – PPO

## 2011-10-24 ENCOUNTER — Emergency Department (HOSPITAL_BASED_OUTPATIENT_CLINIC_OR_DEPARTMENT_OTHER): Payer: BC Managed Care – PPO

## 2011-10-24 ENCOUNTER — Encounter (HOSPITAL_BASED_OUTPATIENT_CLINIC_OR_DEPARTMENT_OTHER): Payer: Self-pay | Admitting: *Deleted

## 2011-10-24 ENCOUNTER — Other Ambulatory Visit: Payer: Self-pay

## 2011-10-24 DIAGNOSIS — R209 Unspecified disturbances of skin sensation: Secondary | ICD-10-CM

## 2011-10-24 DIAGNOSIS — K219 Gastro-esophageal reflux disease without esophagitis: Secondary | ICD-10-CM | POA: Insufficient documentation

## 2011-10-24 DIAGNOSIS — R112 Nausea with vomiting, unspecified: Secondary | ICD-10-CM

## 2011-10-24 DIAGNOSIS — K449 Diaphragmatic hernia without obstruction or gangrene: Secondary | ICD-10-CM | POA: Insufficient documentation

## 2011-10-24 DIAGNOSIS — R0789 Other chest pain: Secondary | ICD-10-CM

## 2011-10-24 DIAGNOSIS — I10 Essential (primary) hypertension: Secondary | ICD-10-CM | POA: Insufficient documentation

## 2011-10-24 DIAGNOSIS — R0602 Shortness of breath: Secondary | ICD-10-CM

## 2011-10-24 DIAGNOSIS — Z79899 Other long term (current) drug therapy: Secondary | ICD-10-CM | POA: Insufficient documentation

## 2011-10-24 LAB — CBC
MCH: 29.5 pg (ref 26.0–34.0)
MCHC: 32.8 g/dL (ref 30.0–36.0)
MCV: 89.8 fL (ref 78.0–100.0)
Platelets: 340 10*3/uL (ref 150–400)
RBC: 4.31 MIL/uL (ref 3.87–5.11)

## 2011-10-24 LAB — COMPREHENSIVE METABOLIC PANEL
ALT: 20 U/L (ref 0–35)
Albumin: 4.1 g/dL (ref 3.5–5.2)
BUN: 11 mg/dL (ref 6–23)
Calcium: 9.9 mg/dL (ref 8.4–10.5)
GFR calc Af Amer: >90 mL/min (ref >90)
Glucose, Bld: 119 mg/dL — ABNORMAL HIGH (ref 70–99)
Sodium: 138 mEq/L (ref 135–145)
Total Protein: 7.4 g/dL (ref 6.0–8.3)

## 2011-10-24 LAB — D-DIMER, QUANTITATIVE: D-Dimer, Quant: <0.22 ug/mL-FEU (ref 0.00–0.48)

## 2011-10-24 LAB — DIFFERENTIAL
Basophils Relative: 0 % (ref 0–1)
Eosinophils Absolute: 0 10*3/uL (ref 0.0–0.7)
Eosinophils Relative: 0 % (ref 0–5)
Lymphs Abs: 1.3 10*3/uL (ref 0.7–4.0)

## 2011-10-24 LAB — CARDIAC PANEL(CRET KIN+CKTOT+MB+TROPI)
CK, MB: 2.3 ng/mL (ref 0.3–4.0)
Troponin I: <0.3 ng/mL (ref <0.30)

## 2011-10-24 MED ORDER — LANSOPRAZOLE 30 MG PO CPDR: 30.0000 mg | DELAYED_RELEASE_CAPSULE | Freq: Every day | ORAL | Status: DC

## 2011-10-24 MED ORDER — ONDANSETRON HCL 4 MG/2ML IJ SOLN
INTRAMUSCULAR | Status: AC
  Filled 2011-10-24: qty 2

## 2011-10-24 MED ORDER — KETOROLAC TROMETHAMINE 30 MG/ML IJ SOLN
INTRAMUSCULAR | Status: AC
  Filled 2011-10-24: qty 1

## 2011-10-24 MED ORDER — KETOROLAC TROMETHAMINE 30 MG/ML IJ SOLN
30.0000 mg | Freq: Once | INTRAMUSCULAR | Status: AC
  Administered 2011-10-24: 30 mg via INTRAVENOUS

## 2011-10-24 MED ORDER — GI COCKTAIL ~~LOC~~
ORAL | Status: AC
  Administered 2011-10-24: 30 mL via ORAL
  Filled 2011-10-24: qty 30

## 2011-10-24 MED ORDER — HYDROMORPHONE HCL PF 1 MG/ML IJ SOLN
1.0000 mg | Freq: Once | INTRAMUSCULAR | Status: AC
  Administered 2011-10-24: 1 mg via INTRAVENOUS

## 2011-10-24 MED ORDER — FAMOTIDINE IN NACL 20-0.9 MG/50ML-% IV SOLN
20.0000 mg | Freq: Once | INTRAVENOUS | Status: AC
  Administered 2011-10-24: 20 mg via INTRAVENOUS
  Filled 2011-10-24: qty 50

## 2011-10-24 MED ORDER — HYDROCODONE-ACETAMINOPHEN 5-325 MG PO TABS: 2.0000 | ORAL_TABLET | ORAL | Status: AC | PRN

## 2011-10-24 MED ORDER — PROMETHAZINE HCL 12.5 MG PO TABS: 25.0000 mg | ORAL_TABLET | Freq: Four times a day (QID) | ORAL | Status: DC | PRN

## 2011-10-24 MED ORDER — GI COCKTAIL ~~LOC~~
30.0000 mL | Freq: Once | ORAL | Status: AC
  Administered 2011-10-24: 30 mL via ORAL

## 2011-10-24 MED ORDER — PROMETHAZINE HCL 25 MG/ML IJ SOLN
12.5000 mg | Freq: Once | INTRAMUSCULAR | Status: AC
  Administered 2011-10-24: 12.5 mg via INTRAVENOUS
  Filled 2011-10-24: qty 1

## 2011-10-24 MED ORDER — SUCRALFATE 1 GM/10ML PO SUSP: 1.0000 g | Freq: Four times a day (QID) | ORAL | Status: DC

## 2011-10-24 MED ORDER — SODIUM CHLORIDE 0.9 % IV SOLN
Freq: Once | INTRAVENOUS | Status: AC
  Administered 2011-10-24: 18:00:00 via INTRAVENOUS

## 2011-10-24 NOTE — ED Provider Notes (Signed)
History     CSN: [id number] Arrival date & time: 10/24/2011  5:06 PM   First MD Initiated Contact with Patient 10/24/11 1712      Chief Complaint  Patient presents with  . Chest Pain    (Consider location/radiation/quality/duration/timing/severity/associated sxs/prior treatment) Patient is a 40 y.o. female presenting with chest pain. The history is provided by the patient. No language interpreter was used.  Chest Pain The chest pain began more  than 1 month ago. Duration of episode(s) is 1 month. Chest pain occurs intermittently. The chest pain is worsening. The pain is associated with eating. At its most intense, the pain is at 10/10. The pain is currently at 10/10. The severity of the pain is severe. The quality of the pain is described as burning and stabbing. The pain does not radiate. Chest pain is worsened by certain positions. Primary symptoms include cough and abdominal pain. Pertinent negatives for primary symptoms include no fever.  Pertinent negatives for associated symptoms include no numbness and no orthopnea. She tried nothing for the symptoms. Risk factors include no known risk factors.  Her family medical history is significant for heart disease in family.   Pt reports she has had her gallbladder removed. Pt reports she has a hiatal hernia.  Pt has been having symptoms for the past month.  No recent travel.  Past Medical History  Diagnosis Date  . Hypertension   . GERD (gastroesophageal reflux disease)     Past Surgical History  Procedure Date  . Abdominal hysterectomy   . Cholecystectomy   . Breast surgery   . Cesarean section   . Knee surgery     History reviewed. No pertinent family history.  History  Substance Use Topics  . Smoking status: Never Smoker   . Smokeless tobacco: Not on file  . Alcohol Use: No     wine    OB History    Grav Para Term Preterm Abortions TAB SAB Ect Mult Living                  Review of Systems  Constitutional:  Negative for fever.  Respiratory: Positive for cough.   Cardiovascular: Positive for chest pain. Negative for orthopnea.  Gastrointestinal: Positive for abdominal pain.  Neurological: Negative for numbness.  All other systems reviewed and are negative.    Allergies  Darvocet; Morphine and related; Percocet; Vicodin; and Zofran  Home Medications   Current Outpatient Rx  Name Route Sig Dispense Refill  . ATENOLOL 50 MG PO TABS Oral Take 50 mg by mouth daily.      Marland Kitchen ESOMEPRAZOLE MAGNESIUM 40 MG PO CPDR Oral Take 40 mg by mouth daily.      Marland Kitchen ZOLPIDEM TARTRATE 5 MG PO TABS Oral Take 5 mg by mouth at bedtime as needed. For sleep      BP 138/83  Pulse 101  Temp 99 F (37.2 C)  Resp 18  Ht 5\' 2"  (1.575 m)  Wt 200 lb (90.719 kg)  BMI 36.58 kg/m2  SpO2 98%  Physical Exam  Nursing note and vitals reviewed. Constitutional: She is oriented to person, place, and time. She appears well-developed and well-nourished.  HENT:  Head: Normocephalic and atraumatic.  Right Ear: External ear normal.  Left Ear: External ear normal.  Nose: Nose normal.  Mouth/Throat: Oropharynx is clear and moist.  Eyes: Conjunctivae and EOM are normal. Pupils are equal, round, and reactive to light.  Neck: Normal range of motion. Neck supple.  Cardiovascular: Normal  rate, regular rhythm and normal heart sounds.   Pulmonary/Chest: Effort normal and breath sounds normal.  Abdominal: There is tenderness.  Musculoskeletal: Normal range of motion.  Neurological: She is alert and oriented to person, place, and time. She has normal reflexes.  Skin: Skin is warm.  Psychiatric: She has a normal mood and affect.    ED Course  Procedures (including critical care time)  Labs Reviewed  DIFFERENTIAL - Abnormal; Notable for the following:    Neutrophils Relative 81 (*)    All other components within normal limits  COMPREHENSIVE METABOLIC PANEL - Abnormal; Notable for the following:    Glucose, Bld 119 (*)     Total Bilirubin 0.2 (*)    GFR calc non Af Amer 79 (*)    All other components within normal limits  CARDIAC PANEL(CRET KIN+CKTOT+MB+TROPI) - Abnormal; Notable for the following:    Total CK 246 (*)    All other components within normal limits  CBC  D-DIMER, QUANTITATIVE  LIPASE, BLOOD   Dg Chest 2 View  10/24/2011  *RADIOLOGY REPORT*  Clinical Data: Chest pain, shortness of breath, nausea, vomiting and right hand numbness.  CHEST - 2 VIEW  Comparison: Previous examinations.  Findings: Normal sized heart.  Clear lungs.  Mild scoliosis. Cholecystectomy clips.  IMPRESSION: No acute abnormality.  Original Report Authenticated By: Darrol Angel, M.D.     No diagnosis found.    MDM  Pt given Iv pepcid and pain medications,  Labs neg ddimer, ck elevated ckmb 2.3   Results for orders placed during the hospital encounter of 10/24/11  CBC      Component Value Range   WBC 8.6  4.0 - 10.5 (K/uL)   RBC 4.31  3.87 - 5.11 (MIL/uL)   Hemoglobin 12.7  12.0 - 15.0 (g/dL)   HCT 91.4  78.2 - 95.6 (%)   MCV 89.8  78.0 - 100.0 (fL)   MCH 29.5  26.0 - 34.0 (pg)   MCHC 32.8  30.0 - 36.0 (g/dL)   RDW 21.3  08.6 - 57.8 (%)   Platelets 340  150 - 400 (K/uL)  DIFFERENTIAL      Component Value Range   Neutrophils Relative 81 (*) 43 - 77 (%)   Neutro Abs 7.0  1.7 - 7.7 (K/uL)   Lymphocytes Relative 15  12 - 46 (%)   Lymphs Abs 1.3  0.7 - 4.0 (K/uL)   Monocytes Relative 4  3 - 12 (%)   Monocytes Absolute 0.3  0.1 - 1.0 (K/uL)   Eosinophils Relative 0  0 - 5 (%)   Eosinophils Absolute 0.0  0.0 - 0.7 (K/uL)   Basophils Relative 0  0 - 1 (%)   Basophils Absolute 0.0  0.0 - 0.1 (K/uL)  COMPREHENSIVE METABOLIC PANEL      Component Value Range   Sodium 138  135 - 145 (mEq/L)   Potassium 4.1  3.5 - 5.1 (mEq/L)   Chloride 105  96 - 112 (mEq/L)   CO2 24  19 - 32 (mEq/L)   Glucose, Bld 119 (*) 70 - 99 (mg/dL)   BUN 11  6 - 23 (mg/dL)   Creatinine, Ser 4.69  0.50 - 1.10 (mg/dL)   Calcium 9.9  8.4 -  10.5 (mg/dL)   Total Protein 7.4  6.0 - 8.3 (g/dL)   Albumin 4.1  3.5 - 5.2 (g/dL)   AST 18  0 - 37 (U/L)   ALT 20  0 - 35 (U/L)  Alkaline Phosphatase 52  39 - 117 (U/L)   Total Bilirubin 0.2 (*) 0.3 - 1.2 (mg/dL)   GFR calc non Af Amer 79 (*) >90 (mL/min)   GFR calc Af Amer >90  >90 (mL/min)  D-DIMER, QUANTITATIVE      Component Value Range   D-Dimer, Quant <0.22  0.00 - 0.48 (ug/mL-FEU)  CARDIAC PANEL(CRET KIN+CKTOT+MB+TROPI)      Component Value Range   Total CK 246 (*) 7 - 177 (U/L)   CK, MB 2.3  0.3 - 4.0 (ng/mL)   Troponin I <0.30  <0.30 (ng/mL)   Relative Index 0.9  0.0 - 2.5   LIPASE, BLOOD      Component Value Range   Lipase 13  11 - 59 (U/L)   Dg Chest 2 View  10/24/2011  *RADIOLOGY REPORT*  Clinical Data: Chest pain, shortness of breath, nausea, vomiting and right hand numbness.  CHEST - 2 VIEW  Comparison: Previous examinations.  Findings: Normal sized heart.  Clear lungs.  Mild scoliosis. Cholecystectomy clips.  IMPRESSION: No acute abnormality.  Original Report Authenticated By: Darrol Angel, M.D.   Pt has multiple allergies.  I will treat with prevacid.  Pt advised to see her MD for recheck on Monday   Date: 10/24/2011  Rate:106  Rhythm: sinus tachycardia  QRS Axis: normal  Intervals: normal  ST/T Wave abnormalities: normal  Conduction Disutrbances:none  Narrative Interpretation:   Old EKG Reviewed: unchanged Pt is requesting admission.  I [place] records from [place].  Pt was seen there yesterday.  Pt had an ultrasound and CTA no pe.  Pt was [facility] 2 [facility] at [facility] for chest pain and had a negative work up.    I doubt cardiac or pulmonary etiology,  Pt had upper Gi done 3 days ago by [facility] GI doctor.  Pt had episode of vomitting after counseling about discharge.  Pt given torodol and phenergan.  Dr. Jeraldine Loots in to see and examine.   Dr. Jeraldine Loots discussed allergies with pt,  vicodin causes nausea and vomitting,  Probably best choice  for pain management.   Langston Masker, [place] 10/24/11 1946  Langston Masker, [place] 10/24/11 1946  Langston Masker, [place] 10/24/11 2203  Langston Masker, [place] 10/24/11 2232  Medical screening examination/treatment/procedure(s) were conducted as a shared visit with non-physician practitioner(s) and myself.  I personally evaluated the patient during the encounter  This 59-year-old female presents with chest pain. Notably the patient has been to multiple facilities, including this one yesterday for similar complaints over last month. Her records were obtained from yesterday here, as well as multiple other facilities. Throughout the past month she has been evaluated for cardiac, pulmonary and GI pathology. Thus far it appears as though no clear cause of her symptoms has been found. A prolonged discussion with the patient and her 2 family members regarding the sensitivity for further outpatient followup. Significant time was spent discussing the conflicting reports of allergies, and history at her previous hospitalizations. The patient denies any true allergy to synthetic narcotics, noting that she is lightheaded, but never has respiratory difficulties nor any new chest pain when she takes anything. Although she requested Dilaudid, she was provided Norco, sucralfate, Phenergan for discharge. The patient has been advised of the necessity to follow up with both her primary care physician and a GI doctor for additional evaluation.  Gerhard Munch, MD 10/24/11 [phone]

## 2011-10-24 NOTE — ED Notes (Signed)
Pt c/o cp x 1 day n/v and SOB.

## 2011-12-21 ENCOUNTER — Inpatient Hospital Stay (HOSPITAL_COMMUNITY): Payer: BC Managed Care – PPO

## 2011-12-21 ENCOUNTER — Inpatient Hospital Stay (HOSPITAL_COMMUNITY)
Admission: AD | Admit: 2011-12-21 | Discharge: 2011-12-21 | Disposition: A | Discharge status: Home / Self Care | Payer: BC Managed Care – PPO | Source: Ambulatory Visit | Attending: Obstetrics and Gynecology | Admitting: Obstetrics and Gynecology

## 2011-12-21 DIAGNOSIS — R110 Nausea: Secondary | ICD-10-CM | POA: Insufficient documentation

## 2011-12-21 DIAGNOSIS — N949 Unspecified condition associated with female genital organs and menstrual cycle: Secondary | ICD-10-CM | POA: Insufficient documentation

## 2011-12-21 DIAGNOSIS — Z90710 Acquired absence of both cervix and uterus: Secondary | ICD-10-CM | POA: Insufficient documentation

## 2011-12-21 DIAGNOSIS — R739 Hyperglycemia, unspecified: Secondary | ICD-10-CM

## 2011-12-21 DIAGNOSIS — R1031 Right lower quadrant pain: Secondary | ICD-10-CM | POA: Insufficient documentation

## 2011-12-21 LAB — URINALYSIS, ROUTINE W REFLEX MICROSCOPIC
Bilirubin Urine: NEGATIVE
Hgb urine dipstick: NEGATIVE
Nitrite: NEGATIVE
Specific Gravity, Urine: <1.005 — ABNORMAL LOW (ref 1.005–1.030)
pH: 5.5 (ref 5.0–8.0)

## 2011-12-21 LAB — WET PREP, GENITAL
Clue Cells Wet Prep HPF POC: NONE SEEN
Trich, Wet Prep: NONE SEEN

## 2011-12-21 LAB — COMPREHENSIVE METABOLIC PANEL
ALT: 19 U/L (ref 0–35)
AST: 15 U/L (ref 0–37)
Calcium: 9.3 mg/dL (ref 8.4–10.5)
GFR calc Af Amer: 82 mL/min — ABNORMAL LOW (ref >90)
Sodium: 136 mEq/L (ref 135–145)
Total Protein: 6.8 g/dL (ref 6.0–8.3)

## 2011-12-21 LAB — CBC
MCH: 30.8 pg (ref 26.0–34.0)
MCV: 91.9 fL (ref 78.0–100.0)
Platelets: 317 10*3/uL (ref 150–400)
RDW: 13.2 % (ref 11.5–15.5)

## 2011-12-21 LAB — DIFFERENTIAL
Basophils Absolute: 0 10*3/uL (ref 0.0–0.1)
Basophils Relative: 0 % (ref 0–1)
Eosinophils Absolute: 0.1 10*3/uL (ref 0.0–0.7)
Eosinophils Relative: 2 % (ref 0–5)

## 2011-12-21 MED ORDER — PROMETHAZINE HCL 25 MG PO TABS
25.0000 mg | ORAL_TABLET | Freq: Four times a day (QID) | ORAL | Status: DC | PRN
  Administered 2011-12-21: 25 mg via ORAL
  Filled 2011-12-21: qty 1

## 2011-12-21 MED ORDER — HYDROMORPHONE HCL 2 MG PO TABS: 2.0000 mg | ORAL_TABLET | ORAL | Status: AC | PRN

## 2011-12-21 MED ORDER — PROMETHAZINE HCL 25 MG PO TABS: 25.0000 mg | ORAL_TABLET | Freq: Four times a day (QID) | ORAL | Status: DC | PRN

## 2011-12-21 MED ORDER — HYDROMORPHONE HCL 2 MG PO TABS
2.0000 mg | ORAL_TABLET | ORAL | Status: DC | PRN
  Administered 2011-12-21: 2 mg via ORAL
  Filled 2011-12-21: qty 1

## 2011-12-21 MED ORDER — KETOROLAC TROMETHAMINE 60 MG/2ML IM SOLN
60.0000 mg | Freq: Once | INTRAMUSCULAR | Status: AC | PRN
  Administered 2011-12-21: 60 mg via INTRAMUSCULAR
  Filled 2011-12-21: qty 2

## 2011-12-21 NOTE — ED Provider Notes (Signed)
History   Skyrah Krupp is a 40y.o. MBF P2002 who presents w/ CC of RLQ pain, worsening over last week, with concerns may be developing ovarian "torsion."  She has tried Tylenol only, and little relief.  Pain is 8.5/10 in RLQ.  Also c/o a watery d/c last few days and feeling a "fluttery" feeling in RLQ as well.  Last Intercourse Tues night.  Last nml BM this AM.  Has had some nausea this AM, but no emesis; only had some "saltines" before coming to MAU.  No UIT s/s.  Reported a temp="101" this AM when called to report s/s and desire to come to MAU for eval.  No resp c/o's.  Currently OOW for holidays until 12/25/11.  Pt's h/o r/f 1.  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  2.  Lt Hemorrhagic cyst w/ subsequent laparoscopy 2/'11 w/ removal of adhesions  3.  GERD  4.  IBS  5.  SVD 1997 & c/s/BTL 2003  6.  Cholecystectomy 2000 7.  Rt Br Bx benign 2003  8.  Endometrial ablation 2004  9.  Left knee meniscus repair 2009  10.  Lap rt ov cystectomy 04/2008 (?torsion) 11. Migraines  12. CHTN   Chief Complaint  Patient presents with  . Nausea  . Vaginal Discharge   HPI   Past Medical History  Diagnosis Date  . Hypertension   . GERD (gastroesophageal reflux disease)     Past Surgical History  Procedure Date  . Abdominal hysterectomy   . Cholecystectomy   . Breast surgery   . Cesarean section   . Knee surgery     No family history on file.  History  Substance Use Topics  . Smoking status: Never Smoker   . Smokeless tobacco: Not on file  . Alcohol Use: No     wine    Allergies:  Allergies  Allergen Reactions  . Darvocet (Propoxyphene N-Acetaminophen) Itching and Nausea And Vomiting  . Morphine And Related Hives and Nausea And Vomiting  . Percocet (Oxycodone-Acetaminophen) Itching and Nausea And Vomiting  . Vicodin (Hydrocodone-Acetaminophen) Itching and Nausea And Vomiting  . Zofran Hives and Nausea Only    Prescriptions prior to admission  Medication Sig Dispense Refill  . acetaminophen (TYLENOL) 500  MG tablet Take 1,000 mg by mouth every 6 (six) hours as needed. Patient used this medication for pain.       Marland Kitchen esomeprazole (NEXIUM) 40 MG capsule Take 40 mg by mouth daily.        . lansoprazole (PREVACID) 30 MG capsule Take 1 capsule (30 mg total) by mouth daily.  30 capsule  0  . sodium chloride (OCEAN) 0.65 % nasal spray Place 1 spray into the nose daily as needed. Patient used this medication for nasal congestion.       Marland Kitchen atenolol (TENORMIN) 50 MG tablet Take 50 mg by mouth daily.        Marland Kitchen zolpidem (AMBIEN) 5 MG tablet Take 5 mg by mouth at bedtime as needed. For sleep        ROS--see HPI Physical Exam   Blood pressure 122/69, pulse 115, temperature 98.6 F (37 C), temperature source Oral, resp. rate 18, height 5\' 2"  (1.575 m), weight 86.183 kg (190 lb). .. Results for orders placed during the hospital encounter of 12/21/11 (from the past 24 hour(s))  CBC     Status: Normal   Collection Time   12/21/11 12:34 PM      Component Value Range   WBC 8.9  4.0 -  10.5 (K/uL)   RBC 4.22  3.87 - 5.11 (MIL/uL)   Hemoglobin 13.0  12.0 - 15.0 (g/dL)   HCT 29.5  62.1 - 30.8 (%)   MCV 91.9  78.0 - 100.0 (fL)   MCH 30.8  26.0 - 34.0 (pg)   MCHC 33.5  30.0 - 36.0 (g/dL)   RDW 65.7  84.6 - 96.2 (%)   Platelets 317  150 - 400 (K/uL)  DIFFERENTIAL     Status: Normal   Collection Time   12/21/11 12:34 PM      Component Value Range   Neutrophils Relative 64  43 - 77 (%)   Neutro Abs 5.8  1.7 - 7.7 (K/uL)   Lymphocytes Relative 30  12 - 46 (%)   Lymphs Abs 2.7  0.7 - 4.0 (K/uL)   Monocytes Relative 4  3 - 12 (%)   Monocytes Absolute 0.4  0.1 - 1.0 (K/uL)   Eosinophils Relative 2  0 - 5 (%)   Eosinophils Absolute 0.1  0.0 - 0.7 (K/uL)   Basophils Relative 0  0 - 1 (%)   Basophils Absolute 0.0  0.0 - 0.1 (K/uL)  COMPREHENSIVE METABOLIC PANEL     Status: Abnormal   Collection Time   12/21/11 12:34 PM      Component Value Range   Sodium 136  135 - 145 (mEq/L)   Potassium 3.6  3.5 - 5.1  (mEq/L)   Chloride 100  96 - 112 (mEq/L)   CO2 25  19 - 32 (mEq/L)   Glucose, Bld 157 (*) 70 - 99 (mg/dL)   BUN 9  6 - 23 (mg/dL)   Creatinine, Ser 9.52  0.50 - 1.10 (mg/dL)   Calcium 9.3  8.4 - 84.1 (mg/dL)   Total Protein 6.8  6.0 - 8.3 (g/dL)   Albumin 3.9  3.5 - 5.2 (g/dL)   AST 15  0 - 37 (U/L)   ALT 19  0 - 35 (U/L)   Alkaline Phosphatase 51  39 - 117 (U/L)   Total Bilirubin 0.5  0.3 - 1.2 (mg/dL)   GFR calc non Af Amer 71 (*) >90 (mL/min)   GFR calc Af Amer 82 (*) >90 (mL/min)  URINALYSIS, ROUTINE W REFLEX MICROSCOPIC     Status: Abnormal   Collection Time   12/21/11 12:40 PM      Component Value Range   Color, Urine YELLOW  YELLOW    APPearance CLEAR  CLEAR    Specific Gravity, Urine <1.005 (*) 1.005 - 1.030    pH 5.5  5.0 - 8.0    Glucose, UA NEGATIVE  NEGATIVE (mg/dL)   Hgb urine dipstick NEGATIVE  NEGATIVE    Bilirubin Urine NEGATIVE  NEGATIVE    Ketones, ur NEGATIVE  NEGATIVE (mg/dL)   Protein, ur NEGATIVE  NEGATIVE (mg/dL)   Urobilinogen, UA 0.2  0.0 - 1.0 (mg/dL)   Nitrite NEGATIVE  NEGATIVE    Leukocytes, UA NEGATIVE  NEGATIVE   HCG, SERUM, QUALITATIVE     Status: Normal   Collection Time   12/21/11 12:52 PM      Component Value Range   Preg, Serum NEGATIVE  NEGATIVE   WET PREP, GENITAL     Status: Abnormal   Collection Time   12/21/11  1:10 PM      Component Value Range   Yeast, Wet Prep NONE SEEN  NONE SEEN    Trich, Wet Prep NONE SEEN  NONE SEEN    Clue Cells, Wet Prep  NONE SEEN  NONE SEEN    WBC, Wet Prep HPF POC FEW (*) NONE SEEN    TRANSABDOMINAL AND TRANSVAGINAL ULTRASOUND OF PELVIS  Technique: Both transabdominal and transvaginal ultrasound  examinations of the pelvis were performed. Transabdominal technique  was performed for global imaging of the pelvis including ovaries,  adnexal regions, and pelvic cul-de-sac.  Comparison: Ultrasound dated 01/28/2010  It was necessary to proceed with endovaginal exam following the  transabdominal exam to  visualizethe ovaries.  Findings:  Uterus: Removed.  Right ovary: 4.3 x 2.4 x 3.6 cm. Dominant follicle measuring 2.3  cm maximum diameter.  Left ovary: Normal. 3.3 x 1.8 x 2.1 cm.  Other findings: No free fluid  IMPRESSION:  Dominant follicle on the right ovary measuring 2.3 cm maximal  diameter. Otherwise, normal exam.  Original Report Authenticated By: Gwynn Burly, M.D.  Physical Exam  Constitutional: She is oriented to person, place, and time. She appears well-developed and well-nourished.       Malaise; grimace; holding lower abdomen as getting pt into lithotomy position for pelvic exam  Cardiovascular: Normal rate and regular rhythm.   Respiratory: Effort normal and breath sounds normal.  GI: Soft. Bowel sounds are normal. She exhibits no distension and no mass. There is tenderness. There is guarding. There is no rebound.       No CVAT; BS + x4  Genitourinary:       Scant white d/c in vault; scant watery d/c at introitus. Difficulty assess cx cuff, and adnexa/ovaries secondary to habitus.  No lesions or blood.  Neurological: She is alert and oriented to person, place, and time. She has normal reflexes.  Skin: Skin is warm and dry.    MAU Course  Procedures 1.  CBC w/ diff 2.  CMET 3.  U/a 4.  Wet prep 5.  Pelvic u/s 6.  Toradol 60mg  IM x1 Assessment and Plan  1.  RLQ pain unknown origin 2.  Nml CBC & CMET, u/a & pelvic u/s--except hyperglycemia 3.  S/p hysterectomy 2010 4.  Chronic pelvic pain 5.  Pain unrelieved w/ Toradol 60mg  IM x1 in MAU 6.  Nausea and coughing up Phlegm/dry heaves in MAU after u/s  1.  Per c/w Dr. Su Hilt after results back, will d/c home on Dilaudid 2mg  po q4hrs prn pain--2mg  given in MAU before d/c, along w/ 25mg  phenergan 2.  Pt to do fasting CBG this Thurs or Friday at [facility] 3.  Schedule f/u appt at [facility] before the end of Jan w/ Dr. Su Hilt or f/u prn 4.  May continue Tylenol prn as well; continue otherwise Rx'd meds  Adonay Scheier  H 12/21/2011, 3:11 PM

## 2011-12-21 NOTE — Progress Notes (Signed)
Patient taken directly to room 6 from lobby.  

## 2011-12-21 NOTE — Progress Notes (Signed)
Pt coughing and vomiting notified CNM.

## 2012-07-27 ENCOUNTER — Telehealth: Payer: Self-pay | Admitting: Obstetrics and Gynecology

## 2012-07-28 ENCOUNTER — Telehealth: Payer: Self-pay | Admitting: Obstetrics and Gynecology

## 2012-07-28 NOTE — Telephone Encounter (Signed)
Tc to pt regarding msg.  Lm on vm to call back. 

## 2012-07-28 NOTE — Telephone Encounter (Signed)
[name]/ [facility]

## 2012-07-29 ENCOUNTER — Telehealth: Payer: Self-pay

## 2012-07-29 NOTE — Telephone Encounter (Signed)
Left pt a message to return call regarding her call about ER visit. [name]

## 2012-12-27 ENCOUNTER — Emergency Department (HOSPITAL_BASED_OUTPATIENT_CLINIC_OR_DEPARTMENT_OTHER)
Admission: EM | Admit: 2012-12-27 | Discharge: 2012-12-28 | Disposition: A | Discharge status: Home / Self Care | Payer: BC Managed Care – PPO | Attending: Emergency Medicine | Admitting: Emergency Medicine

## 2012-12-27 ENCOUNTER — Encounter (HOSPITAL_BASED_OUTPATIENT_CLINIC_OR_DEPARTMENT_OTHER): Payer: Self-pay | Admitting: *Deleted

## 2012-12-27 ENCOUNTER — Emergency Department (HOSPITAL_BASED_OUTPATIENT_CLINIC_OR_DEPARTMENT_OTHER): Payer: BC Managed Care – PPO

## 2012-12-27 DIAGNOSIS — I10 Essential (primary) hypertension: Secondary | ICD-10-CM | POA: Insufficient documentation

## 2012-12-27 DIAGNOSIS — R112 Nausea with vomiting, unspecified: Secondary | ICD-10-CM | POA: Insufficient documentation

## 2012-12-27 DIAGNOSIS — J029 Acute pharyngitis, unspecified: Secondary | ICD-10-CM | POA: Insufficient documentation

## 2012-12-27 DIAGNOSIS — Z79899 Other long term (current) drug therapy: Secondary | ICD-10-CM | POA: Insufficient documentation

## 2012-12-27 DIAGNOSIS — R509 Fever, unspecified: Secondary | ICD-10-CM | POA: Insufficient documentation

## 2012-12-27 DIAGNOSIS — K219 Gastro-esophageal reflux disease without esophagitis: Secondary | ICD-10-CM | POA: Insufficient documentation

## 2012-12-27 DIAGNOSIS — B349 Viral infection, unspecified: Secondary | ICD-10-CM

## 2012-12-27 DIAGNOSIS — R51 Headache: Secondary | ICD-10-CM | POA: Insufficient documentation

## 2012-12-27 DIAGNOSIS — B9789 Other viral agents as the cause of diseases classified elsewhere: Secondary | ICD-10-CM | POA: Insufficient documentation

## 2012-12-27 DIAGNOSIS — R197 Diarrhea, unspecified: Secondary | ICD-10-CM | POA: Insufficient documentation

## 2012-12-27 LAB — BASIC METABOLIC PANEL
BUN: 8 mg/dL (ref 6–23)
Chloride: 99 mEq/L (ref 96–112)
Creatinine, Ser: 1.1 mg/dL (ref 0.50–1.10)
GFR calc non Af Amer: 61 mL/min — ABNORMAL LOW (ref >90)
Glucose, Bld: 101 mg/dL — ABNORMAL HIGH (ref 70–99)
Potassium: 3.8 mEq/L (ref 3.5–5.1)

## 2012-12-27 LAB — URINALYSIS, ROUTINE W REFLEX MICROSCOPIC
Bilirubin Urine: NEGATIVE
Hgb urine dipstick: NEGATIVE
Ketones, ur: NEGATIVE mg/dL
Specific Gravity, Urine: 1.016 (ref 1.005–1.030)
Urobilinogen, UA: 1 mg/dL (ref 0.0–1.0)

## 2012-12-27 MED ORDER — ACETAMINOPHEN 325 MG PO TABS
650.0000 mg | ORAL_TABLET | Freq: Once | ORAL | Status: AC
  Administered 2012-12-27: 650 mg via ORAL
  Filled 2012-12-27: qty 2

## 2012-12-27 MED ORDER — SODIUM CHLORIDE 0.9 % IV BOLUS (SEPSIS)
1000.0000 mL | Freq: Once | INTRAVENOUS | Status: AC
  Administered 2012-12-27: 1000 mL via INTRAVENOUS

## 2012-12-27 MED ORDER — GUAIFENESIN-CODEINE 100-10 MG/5ML PO SOLN
10.0000 mL | Freq: Once | ORAL | Status: AC
  Administered 2012-12-27: 10 mL via ORAL
  Filled 2012-12-27: qty 10

## 2012-12-27 MED ORDER — PROMETHAZINE HCL 25 MG/ML IJ SOLN
25.0000 mg | Freq: Once | INTRAMUSCULAR | Status: AC
  Administered 2012-12-27: 25 mg via INTRAVENOUS
  Filled 2012-12-27: qty 1

## 2012-12-27 MED ORDER — PROMETHAZINE-CODEINE 6.25-10 MG/5ML PO SYRP: 5.0000 mL | ORAL_SOLUTION | ORAL | Status: DC | PRN

## 2012-12-27 MED ORDER — GI COCKTAIL ~~LOC~~
ORAL | Status: AC
  Administered 2012-12-27: 30 mL
  Filled 2012-12-27: qty 30

## 2012-12-27 MED ORDER — KETOROLAC TROMETHAMINE 30 MG/ML IJ SOLN
15.0000 mg | Freq: Once | INTRAMUSCULAR | Status: AC
  Administered 2012-12-27: 15 mg via INTRAVENOUS
  Filled 2012-12-27: qty 1

## 2012-12-27 NOTE — ED Notes (Signed)
Cough fever nausea vomiting aching all over headache and sore throat since this am.

## 2012-12-27 NOTE — ED Provider Notes (Signed)
History     CSN: [id number]  Arrival date & time 12/27/12  1843   First MD Initiated Contact with Patient 12/27/12 2011      Chief Complaint  Patient presents with  . Cough    (Consider location/radiation/quality/duration/timing/severity/associated sxs/prior treatment) HPI Comments: [name] is a 42 y.o. Who presents with flu like symptoms including nonproductive cough, headache,  Nausea with several episodes of vomiting and diarrhea with a mild sore throat which has become more sore with vomiting, since this morning.  Her fever has been subjective, but she has been intermittently chilled and flushed.  She has taken no medications prior to arrival.  She denies dizziness when she stands, but has not tolerated PO fluids due to nausea.  Pertinent negatives include no sob,  Chest pain and no abdominal pain.  The history is provided by the patient.    Past Medical History  Diagnosis Date  . Hypertension   . GERD (gastroesophageal reflux disease)     Past Surgical History  Procedure Date  . Abdominal hysterectomy   . Cholecystectomy   . Breast surgery   . Cesarean section   . Knee surgery     No family history on file.  History  Substance Use Topics  . Smoking status: Never Smoker   . Smokeless tobacco: Not on file  . Alcohol Use: No     Comment: wine    OB History    Grav Para Term Preterm Abortions TAB SAB Ect Mult Living                  Review of Systems  Constitutional: Positive for fever and chills.  HENT: Positive for sore throat. Negative for congestion and neck pain.   Eyes: Negative.   Respiratory: Positive for cough. Negative for chest tightness and shortness of breath.   Cardiovascular: Negative for chest pain.  Gastrointestinal: Positive for nausea, vomiting and diarrhea. Negative for abdominal pain.  Genitourinary: Negative.   Musculoskeletal: Negative for joint swelling and arthralgias.  Skin: Negative.  Negative for rash and wound.    Neurological: Negative for dizziness, weakness, light-headedness, numbness and headaches.  Hematological: Negative.   Psychiatric/Behavioral: Negative.     Allergies  Darvocet; Morphine and related; Percocet; Vicodin; and Zofran  Home Medications   Current Outpatient Rx  Name  Route  Sig  Dispense  Refill  . ACETAMINOPHEN 500 MG PO TABS   Oral   Take 1,000 mg by mouth every 6 (six) hours as needed. Patient used this medication for pain.          . ATENOLOL 50 MG PO TABS   Oral   Take 50 mg by mouth daily.           Marland Kitchen ESOMEPRAZOLE MAGNESIUM 40 MG PO CPDR   Oral   Take 40 mg by mouth daily.           Marland Kitchen LANSOPRAZOLE 30 MG PO CPDR   Oral   Take 1 capsule (30 mg total) by mouth daily.   30 capsule   0   . PROMETHAZINE-CODEINE 6.25-10 MG/5ML PO SYRP   Oral   Take 5 mLs by mouth every 4 (four) hours as needed for cough.   120 mL   0   . SALINE NASAL SPRAY 0.65 % NA SOLN   Nasal   Place 1 spray into the nose daily as needed. Patient used this medication for nasal congestion.          Marland Kitchen  ZOLPIDEM TARTRATE 5 MG PO TABS   Oral   Take 5 mg by mouth at bedtime as needed. For sleep           BP 133/79  Pulse 103  Temp 98.8 F (37.1 C) (Oral)  Resp 18  SpO2 97%  Physical Exam  Constitutional: She is oriented to person, place, and time. She appears well-developed and well-nourished.  HENT:  Head: Normocephalic and atraumatic.  Right Ear: Tympanic membrane and ear canal normal.  Left Ear: Tympanic membrane and ear canal normal.  Nose: No mucosal edema or rhinorrhea.  Mouth/Throat: Uvula is midline, oropharynx is clear and moist and mucous membranes are normal. No oropharyngeal exudate, posterior oropharyngeal edema, posterior oropharyngeal erythema or tonsillar abscesses.  Eyes: Conjunctivae normal are normal.  Cardiovascular: Normal rate and normal heart sounds.   Pulmonary/Chest: Effort normal. No respiratory distress. She has no wheezes. She has no rhonchi.  She has no rales.  Abdominal: Soft. There is no tenderness. There is no rigidity, no rebound and no guarding.  Musculoskeletal: Normal range of motion.  Neurological: She is alert and oriented to person, place, and time.  Skin: Skin is warm and dry. No rash noted.  Psychiatric: She has a normal mood and affect.    ED Course  Procedures (including critical care time)  Labs Reviewed  BASIC METABOLIC PANEL - Abnormal; Notable for the following:    Glucose, Bld 101 (*)     GFR calc non Af Amer 61 (*)     GFR calc Af Amer 71 (*)     All other components within normal limits  URINALYSIS, ROUTINE W REFLEX MICROSCOPIC   Dg Chest 2 View  12/27/2012  *RADIOLOGY REPORT*  Clinical Data: Cough and fever.  CHEST - 2 VIEW  Comparison: Plain films of the chest 10/24/2011 and CT chest 12/30/2009.  Findings: Lungs are clear.  Heart size is normal.  No pneumothorax or pleural fluid.  IMPRESSION: Negative chest.   Original Report Authenticated By: Holley Dexter, M.D.      1. Viral syndrome       MDM  Patients labs and/or radiological studies were reviewed during the medical decision making and disposition process. Pt given IV fluids along with phenergan and toradol IV - nausea resolved,  Still with myalgias and cough,  Guaifenesin with codeine given with improved cough.  Pt had no emesis while in ed.  She did tolerate PO fluids prior to dc home.  Flu like symptoms,  Pt in no distress at time of dc.  The patient appears reasonably screened and/or stabilized for discharge and I doubt any other medical condition or other [facility] requiring further screening, evaluation, or treatment in the ED at this time prior to discharge. Phenergan with codeine prescribed for home,  Encouraged rest,  Increased fluids, tylenol/motrin.        Burgess Amor, [place] 12/28/12 [phone]

## 2012-12-28 NOTE — ED Provider Notes (Signed)
Medical screening examination/treatment/procedure(s) were performed by non-physician practitioner and as supervising physician I was immediately available for consultation/collaboration.  Delecia Vastine M Douglas Rooks, MD 12/28/12 2322 

## 2013-03-22 ENCOUNTER — Emergency Department (HOSPITAL_BASED_OUTPATIENT_CLINIC_OR_DEPARTMENT_OTHER): Payer: BC Managed Care – PPO

## 2013-03-22 ENCOUNTER — Encounter (HOSPITAL_BASED_OUTPATIENT_CLINIC_OR_DEPARTMENT_OTHER): Payer: Self-pay | Admitting: *Deleted

## 2013-03-22 ENCOUNTER — Emergency Department (HOSPITAL_BASED_OUTPATIENT_CLINIC_OR_DEPARTMENT_OTHER)
Admission: EM | Admit: 2013-03-22 | Discharge: 2013-03-22 | Disposition: A | Discharge status: Home / Self Care | Payer: BC Managed Care – PPO | Attending: Emergency Medicine | Admitting: Emergency Medicine

## 2013-03-22 DIAGNOSIS — Z9089 Acquired absence of other organs: Secondary | ICD-10-CM | POA: Insufficient documentation

## 2013-03-22 DIAGNOSIS — R109 Unspecified abdominal pain: Secondary | ICD-10-CM | POA: Insufficient documentation

## 2013-03-22 DIAGNOSIS — Z90710 Acquired absence of both cervix and uterus: Secondary | ICD-10-CM | POA: Insufficient documentation

## 2013-03-22 DIAGNOSIS — I10 Essential (primary) hypertension: Secondary | ICD-10-CM | POA: Insufficient documentation

## 2013-03-22 DIAGNOSIS — K219 Gastro-esophageal reflux disease without esophagitis: Secondary | ICD-10-CM | POA: Insufficient documentation

## 2013-03-22 DIAGNOSIS — Z79899 Other long term (current) drug therapy: Secondary | ICD-10-CM | POA: Insufficient documentation

## 2013-03-22 LAB — CBC WITH DIFFERENTIAL/PLATELET
Basophils Relative: 0 % (ref 0–1)
Eosinophils Absolute: 0.2 10*3/uL (ref 0.0–0.7)
HCT: 40.1 % (ref 36.0–46.0)
Hemoglobin: 13.4 g/dL (ref 12.0–15.0)
MCH: 30 pg (ref 26.0–34.0)
MCHC: 33.4 g/dL (ref 30.0–36.0)
Monocytes Absolute: 0.6 10*3/uL (ref 0.1–1.0)
Monocytes Relative: 6 % (ref 3–12)

## 2013-03-22 LAB — COMPREHENSIVE METABOLIC PANEL
Albumin: 4.2 g/dL (ref 3.5–5.2)
BUN: 7 mg/dL (ref 6–23)
Calcium: 9.6 mg/dL (ref 8.4–10.5)
Creatinine, Ser: 0.9 mg/dL (ref 0.50–1.10)
Total Protein: 7.3 g/dL (ref 6.0–8.3)

## 2013-03-22 LAB — TROPONIN I: Troponin I: <0.3 ng/mL (ref <0.30)

## 2013-03-22 LAB — LIPASE, BLOOD: Lipase: 17 U/L (ref 11–59)

## 2013-03-22 MED ORDER — KETOROLAC TROMETHAMINE 30 MG/ML IJ SOLN
30.0000 mg | Freq: Once | INTRAMUSCULAR | Status: AC
  Administered 2013-03-22: 30 mg via INTRAVENOUS
  Filled 2013-03-22: qty 1

## 2013-03-22 MED ORDER — PROMETHAZINE HCL 25 MG PO TABS: 25.0000 mg | ORAL_TABLET | Freq: Four times a day (QID) | ORAL | Status: DC | PRN

## 2013-03-22 MED ORDER — HYDROMORPHONE HCL PF 1 MG/ML IJ SOLN
1.0000 mg | Freq: Once | INTRAMUSCULAR | Status: AC
  Administered 2013-03-22: 1 mg via INTRAVENOUS
  Filled 2013-03-22: qty 1

## 2013-03-22 MED ORDER — SODIUM CHLORIDE 0.9 % IV BOLUS (SEPSIS)
1000.0000 mL | Freq: Once | INTRAVENOUS | Status: AC
  Administered 2013-03-22: 1000 mL via INTRAVENOUS

## 2013-03-22 MED ORDER — PROMETHAZINE HCL 25 MG/ML IJ SOLN
12.5000 mg | Freq: Once | INTRAMUSCULAR | Status: AC
  Administered 2013-03-22: 12.5 mg via INTRAVENOUS
  Filled 2013-03-22: qty 1

## 2013-03-22 MED ORDER — OXYCODONE-ACETAMINOPHEN 5-325 MG PO TABS: 2.0000 | ORAL_TABLET | ORAL | Status: DC | PRN

## 2013-03-22 NOTE — ED Provider Notes (Signed)
History     CSN: [id number]  Arrival date & time 03/22/13  1947   First MD Initiated Contact with Patient 03/22/13 2027      Chief Complaint  Patient presents with  . Chest Pain    (Consider location/radiation/quality/duration/timing/severity/associated sxs/prior treatment) HPI Comments: Patient presents with discomfort in the left posterior chest and upper abdomen that started suddenly about three hours ago while picking her daughter up from school.  She is very nauseated and feels off balance.  No fevers or chills.  No diarrhea.    Patient is a 42 y.o. female presenting with chest pain. The history is provided by the patient.  Chest Pain Pain location:  L chest Pain quality: sharp   Pain radiates to:  Does not radiate Pain radiates to the back: no   Pain severity:  Moderate Onset quality:  Sudden Duration:  3 hours Timing:  Constant Progression:  Worsening Chronicity:  New Context: not breathing and no movement   Relieved by:  Nothing Worsened by:  Nothing tried Ineffective treatments:  None tried   Past Medical History  Diagnosis Date  . Hypertension   . GERD (gastroesophageal reflux disease)     Past Surgical History  Procedure Laterality Date  . Abdominal hysterectomy    . Cholecystectomy    . Breast surgery    . Cesarean section    . Knee surgery      History reviewed. No pertinent family history.  History  Substance Use Topics  . Smoking status: Never Smoker   . Smokeless tobacco: Not on file  . Alcohol Use: No     Comment: wine    OB History   Grav Para Term Preterm Abortions TAB SAB Ect Mult Living                  Review of Systems  Cardiovascular: Positive for chest pain.  All other systems reviewed and are negative.    Allergies  Darvocet; Morphine and related; Percocet; Vicodin; and Zofran  Home Medications   Current Outpatient Rx  Name  Route  Sig  Dispense  Refill  . atenolol (TENORMIN) 50 MG tablet   Oral   Take 50 mg by  mouth daily.           Marland Kitchen esomeprazole (NEXIUM) 40 MG capsule   Oral   Take 40 mg by mouth daily.           Marland Kitchen zolpidem (AMBIEN) 5 MG tablet   Oral   Take 5 mg by mouth at bedtime as needed. For sleep         . acetaminophen (TYLENOL) 500 MG tablet   Oral   Take 1,000 mg by mouth every 6 (six) hours as needed. Patient used this medication for pain.          Marland Kitchen EXPIRED: lansoprazole (PREVACID) 30 MG capsule   Oral   Take 1 capsule (30 mg total) by mouth daily.   30 capsule   0   . promethazine-codeine (PHENERGAN WITH CODEINE) 6.25-10 MG/5ML syrup   Oral   Take 5 mLs by mouth every 4 (four) hours as needed for cough.   120 mL   0   . sodium chloride (OCEAN) 0.65 % nasal spray   Nasal   Place 1 spray into the nose daily as needed. Patient used this medication for nasal congestion.            BP 154/87  Pulse 102  Temp(Src) 99.4 F (  37.4 C) (Oral)  Resp 22  Ht 5\' 2"  (1.575 m)  Wt 195 lb (88.451 kg)  BMI 35.66 kg/m2  SpO2 100%  Physical Exam  Nursing note and vitals reviewed. Constitutional: She is oriented to person, place, and time. She appears well-developed and well-nourished. No distress.  HENT:  Head: Normocephalic and atraumatic.  Neck: Normal range of motion. Neck supple.  Cardiovascular: Normal rate and regular rhythm.  Exam reveals no gallop and no friction rub.   No murmur heard. Pulmonary/Chest: Effort normal and breath sounds normal. No respiratory distress. She has no wheezes.  Abdominal: Soft. Bowel sounds are normal. She exhibits no distension. There is no tenderness.  Musculoskeletal: Normal range of motion.  Neurological: She is alert and oriented to person, place, and time.  Skin: Skin is warm and dry. She is not diaphoretic.    ED Course  Procedures (including critical care time)  Labs Reviewed  CBC WITH DIFFERENTIAL  COMPREHENSIVE METABOLIC PANEL  LIPASE, BLOOD  TROPONIN I   No results found.   No diagnosis found.   Date:  03/22/2013  Rate: 115  Rhythm: sinus tachycardia  QRS Axis: normal  Intervals: normal  ST/T Wave abnormalities: normal  Conduction Disutrbances:none  Narrative Interpretation:   Old EKG Reviewed: unchanged    MDM  The patient presents with pain in the left chest and nausea that started this evening.  The symptoms are atypical for cardiac pain and the workup does not reflect a cardiac etiology.  The labs and are otherwise normal.  She appears well and seems to be feeling better after the medications given.  I doubt there is an emergent cause to her symptoms and I believe she is stable for discharge.  She is requesting a prescription for dilaudid which I did not agree to provide her with.  She was given a small number of percocet for her pain.  She is to see her pcp if not improving.        Geoffery Lyons, MD 03/22/13 2350

## 2013-03-22 NOTE — ED Notes (Signed)
MD at bedside. 

## 2013-03-22 NOTE — ED Notes (Signed)
Pt c/o left sided CP that started this evening with nausea and SOB. Pt denies any cold or cough.

## 2013-03-22 NOTE — ED Notes (Signed)
Pt is vomiting at triage. 

## 2013-04-07 ENCOUNTER — Inpatient Hospital Stay (HOSPITAL_COMMUNITY)
Admission: AD | Admit: 2013-04-07 | Discharge: 2013-04-07 | Disposition: A | Discharge status: Home / Self Care | Payer: BC Managed Care – PPO | Source: Ambulatory Visit | Attending: Obstetrics and Gynecology | Admitting: Obstetrics and Gynecology

## 2013-04-07 ENCOUNTER — Encounter (HOSPITAL_COMMUNITY): Payer: Self-pay | Admitting: *Deleted

## 2013-04-07 DIAGNOSIS — R109 Unspecified abdominal pain: Secondary | ICD-10-CM | POA: Insufficient documentation

## 2013-04-07 DIAGNOSIS — Z90710 Acquired absence of both cervix and uterus: Secondary | ICD-10-CM | POA: Insufficient documentation

## 2013-04-07 DIAGNOSIS — N949 Unspecified condition associated with female genital organs and menstrual cycle: Secondary | ICD-10-CM | POA: Insufficient documentation

## 2013-04-07 LAB — CBC WITH DIFFERENTIAL/PLATELET
Basophils Relative: 1 % (ref 0–1)
Hemoglobin: 12.7 g/dL (ref 12.0–15.0)
MCHC: 33.4 g/dL (ref 30.0–36.0)
Monocytes Relative: 6 % (ref 3–12)
Neutro Abs: 4.9 10*3/uL (ref 1.7–7.7)
Neutrophils Relative %: 51 % (ref 43–77)
RBC: 4.26 MIL/uL (ref 3.87–5.11)

## 2013-04-07 MED ORDER — KETOROLAC TROMETHAMINE 60 MG/2ML IM SOLN
60.0000 mg | Freq: Once | INTRAMUSCULAR | Status: AC
  Administered 2013-04-07: 60 mg via INTRAMUSCULAR
  Filled 2013-04-07: qty 2

## 2013-04-07 NOTE — MAU Note (Signed)
Severe pain in left side, became excruciating this morning.  Having pain for 2 wks.  Pain is similar to when had torsion.  Came from office, had [place] there, they were not able to get good visualization of left ovary due to bowel.

## 2013-04-07 NOTE — MAU Provider Note (Signed)
History     CSN: [id number]  Arrival date and time: 04/07/13 1634   None     Chief Complaint  Patient presents with  . Abdominal Pain   HPI Comments: Pt is a 41yo pt arrived to MAU after being seen at the office today by C.Armor, CNM for L sided pelvic pain. The pain has been ongoing worse over the last few weeks and especially worsened this morning. 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  She states the pain is constant, nothing has relieved it, nothing makes it worse. She reports nausea today, vomited on the way to office this morning. (tolerated crackers and motrin at office) Denies any fever/aches/chills. Reports normal BM's, last this AM.  States she was seen by GI last week, and has appt at [facility] for upper GI to "check esophagus"  Pelvic exam and bimanual exam done at office, wet prep +clue cells, pt was given rx for flagyl. Pelvic US at office was unable to visualize L Ovary due to bowel gas, no free fluid in CDS or adnexal mass. R Ovary and uterus surgically absent.  PE otherwise benign, per notes.   Of note, after reviewing notes in epic, and C.Armor's note in [place], several inconsistencies are noted. Pt states she was instructed to come to MAU for "further tests" and that the "ER could do a better [place]" When I offered tramadol for pain relief pt states "that never works" and that the only thing that has helped her with pain is "dilaudid" Pt Initially told me her only allergy was "darvocet" then stated she couldn't take percocet or vicodin. Offered Rx for toradol and pt declined.   Initially consulted w Dr Stefano Gaul, who was on call during the day and had already discussed this pt w CA. She was given toradol 60mg  IM x1, and reported no relief of  pain 10/10.  I then consulted Dr Pennie Rushing who was on call for the evening, and again clarified, (as I told pt) that it would not be necessary to repeat the pelvic ultrasound today.      Also, of note, per epic notes, pt rcv'd RX for percocet when seen at [facility], per Dr Judd Lien (seen for cc chest pain, work-up negative) Note also states pt requested rx for dilaudid, and was declined, rx for percocet was given.  Pharmacy tech here [facility]) deleted percocet and phenergan from pt history because pt told pharmacy she has not taken either of those in the last 30 days.      Past Medical History  Diagnosis Date  . Hypertension   . GERD (gastroesophageal reflux disease)     Past Surgical History  Procedure Laterality Date  . Abdominal hysterectomy    . Cholecystectomy    . Breast surgery    . Cesarean section    . Knee surgery      History reviewed. No pertinent family history.  History  Substance Use Topics  . Smoking status: Never Smoker   . Smokeless tobacco: Not on file  . Alcohol Use: No     Comment: wine    Allergies:  Allergies  Allergen Reactions  . Darvocet (Propoxyphene-Acetaminophen) Itching and Nausea And Vomiting  . Morphine And Related Hives and Nausea And Vomiting  .  Percocet (Oxycodone-Acetaminophen) Itching and Nausea And Vomiting  . Vicodin (Hydrocodone-Acetaminophen) Itching and Nausea And Vomiting  . Zofran Hives and Nausea Only    Prescriptions prior to admission  Medication Sig Dispense Refill  . atenolol (TENORMIN) 50 MG tablet Take 50 mg by mouth daily.        Marland Kitchen esomeprazole (NEXIUM) 40 MG capsule Take 40 mg by mouth daily.        . sodium chloride (OCEAN) 0.65 % nasal spray Place 1 spray into the nose daily as needed. Patient used this medication for nasal congestion.       Marland Kitchen zolpidem (AMBIEN) 5 MG tablet Take 5 mg by mouth at bedtime as needed. For sleep      . lansoprazole (PREVACID) 30 MG capsule Take 1 capsule (30 mg total) by mouth daily.  30 capsule  0   . promethazine (PHENERGAN) 12.5 MG tablet Take 2 tablets (25 mg total) by mouth every 6 (six) hours as needed for nausea.  30 tablet  0  . promethazine (PHENERGAN) 25 MG tablet Take 1 tablet (25 mg total) by mouth every 6 (six) hours as needed.  30 tablet  1  . [DISCONTINUED] acetaminophen (TYLENOL) 500 MG tablet Take 1,000 mg by mouth every 6 (six) hours as needed. Patient used this medication for pain.       . [DISCONTINUED] oxyCODONE-acetaminophen (PERCOCET) 5-325 MG per tablet Take 2 tablets by mouth every 4 (four) hours as needed for pain.  12 tablet  0  . [DISCONTINUED] promethazine (PHENERGAN) 25 MG tablet Take 1 tablet (25 mg total) by mouth every 6 (six) hours as needed for nausea.  10 tablet  0    Review of Systems  Constitutional: Negative for weight loss and malaise/fatigue.  Respiratory: Negative for cough and shortness of breath.   Cardiovascular: Negative for chest pain.  Gastrointestinal: Positive for heartburn, nausea and abdominal pain. Negative for vomiting, diarrhea, constipation, blood in stool and melena.  Genitourinary: Negative for dysuria, urgency, frequency, hematuria and flank pain.  Musculoskeletal: Negative for back pain.  Neurological: Negative for dizziness, weakness and headaches.  Psychiatric/Behavioral: Negative for depression.  All other systems reviewed and are negative.   Physical Exam   Blood pressure 129/71, pulse 102, temperature 99.7 F (37.6 C), temperature source Oral, resp. rate 20, height 5\' 2"  (1.575 m), weight 215 lb (97.523 kg).  Physical Exam  Nursing note and vitals reviewed. Constitutional: She is oriented to person, place, and time. She appears well-developed and well-nourished.  Pt initially calm when I entered room, after having rcv'd toradol about an hour later, pt was crying, lying on R side and rocking back and forth   HENT:  Head: Normocephalic.  Eyes: Pupils are equal, round, and reactive to light.  Cardiovascular: Normal  rate, regular rhythm and normal heart sounds.   Respiratory: Effort normal and breath sounds normal.  GI: Soft. Bowel sounds are normal. She exhibits no distension and no mass. There is no tenderness. There is no rebound and no guarding.  Genitourinary:  Deferred, had pelvic exam in office today   Musculoskeletal: Normal range of motion.  Neurological: She is alert and oriented to person, place, and time. She has normal reflexes.  Skin: Skin is warm and dry.  Psychiatric: She has a normal mood and affect. Her behavior is normal.    MAU Course  Procedures toradol 60mg  IM CBC   Assessment and Plan  41yo MBF  Lower left pelvic pain that appears to be of  no acute origin Hx of chronic pelvic pain  VSS CBC WNL  Wet prep +clue cells (done at office) UA neg (from office)  GC/CT (urine, pending from office)  Per c/w Dr Pennie Rushing, (as above), pt to keep appts w GI and appt w Dr Su Hilt on 05/05/13, will ask office to clarify with Dr Su Hilt if she recommends a bowel cleanse and/or repeat of pelvic US in the office prior to that appt.  Pt instructed to take tylenol and/or motrin, offered tramadol, (pt declined), recommended "salonpas" patches for pain relief  Again, declined pt's request for dilaudid  Pt discharged home in stable condition       Haleema Vanderheyden M 04/07/2013, 5:44 PM (Pt discharged at approximately 830pm)

## 2013-04-07 NOTE — MAU Note (Signed)
Nausea and heartburn. No diarrhea or constipation. No urinary symptoms.

## 2013-04-14 ENCOUNTER — Emergency Department (HOSPITAL_BASED_OUTPATIENT_CLINIC_OR_DEPARTMENT_OTHER): Payer: BC Managed Care – PPO

## 2013-04-14 ENCOUNTER — Encounter (HOSPITAL_BASED_OUTPATIENT_CLINIC_OR_DEPARTMENT_OTHER): Payer: Self-pay | Admitting: *Deleted

## 2013-04-14 ENCOUNTER — Emergency Department (HOSPITAL_BASED_OUTPATIENT_CLINIC_OR_DEPARTMENT_OTHER)
Admission: EM | Admit: 2013-04-14 | Discharge: 2013-04-14 | Disposition: A | Discharge status: Home / Self Care | Payer: BC Managed Care – PPO | Attending: Emergency Medicine | Admitting: Emergency Medicine

## 2013-04-14 ENCOUNTER — Other Ambulatory Visit: Payer: Self-pay

## 2013-04-14 DIAGNOSIS — Z90710 Acquired absence of both cervix and uterus: Secondary | ICD-10-CM | POA: Insufficient documentation

## 2013-04-14 DIAGNOSIS — R110 Nausea: Secondary | ICD-10-CM | POA: Insufficient documentation

## 2013-04-14 DIAGNOSIS — Z79899 Other long term (current) drug therapy: Secondary | ICD-10-CM | POA: Insufficient documentation

## 2013-04-14 DIAGNOSIS — R197 Diarrhea, unspecified: Secondary | ICD-10-CM | POA: Insufficient documentation

## 2013-04-14 DIAGNOSIS — K219 Gastro-esophageal reflux disease without esophagitis: Secondary | ICD-10-CM | POA: Insufficient documentation

## 2013-04-14 DIAGNOSIS — R079 Chest pain, unspecified: Secondary | ICD-10-CM | POA: Insufficient documentation

## 2013-04-14 DIAGNOSIS — Z8742 Personal history of other diseases of the female genital tract: Secondary | ICD-10-CM | POA: Insufficient documentation

## 2013-04-14 DIAGNOSIS — I10 Essential (primary) hypertension: Secondary | ICD-10-CM | POA: Insufficient documentation

## 2013-04-14 DIAGNOSIS — Z9089 Acquired absence of other organs: Secondary | ICD-10-CM | POA: Insufficient documentation

## 2013-04-14 HISTORY — DX: Unspecified ovarian cyst, unspecified side: N83.209

## 2013-04-14 LAB — CBC WITH DIFFERENTIAL/PLATELET
Lymphocytes Relative: 30 % (ref 12–46)
MCH: 30.2 pg (ref 26.0–34.0)
MCHC: 33.6 g/dL (ref 30.0–36.0)
Monocytes Absolute: 0.6 10*3/uL (ref 0.1–1.0)
Monocytes Relative: 7 % (ref 3–12)
Neutrophils Relative %: 62 % (ref 43–77)
Platelets: 334 10*3/uL (ref 150–400)
RBC: 4.4 MIL/uL (ref 3.87–5.11)
RDW: 12.7 % (ref 11.5–15.5)

## 2013-04-14 LAB — BASIC METABOLIC PANEL
BUN: 5 mg/dL — ABNORMAL LOW (ref 6–23)
CO2: 24 mEq/L (ref 19–32)
Chloride: 106 mEq/L (ref 96–112)
Creatinine, Ser: 0.9 mg/dL (ref 0.50–1.10)
Glucose, Bld: 116 mg/dL — ABNORMAL HIGH (ref 70–99)

## 2013-04-14 MED ORDER — HYDROMORPHONE HCL PF 1 MG/ML IJ SOLN
1.0000 mg | Freq: Once | INTRAMUSCULAR | Status: AC
  Administered 2013-04-14: 1 mg via INTRAVENOUS
  Filled 2013-04-14: qty 1

## 2013-04-14 MED ORDER — PROMETHAZINE HCL 25 MG PO TABS: 25.0000 mg | ORAL_TABLET | Freq: Four times a day (QID) | ORAL | Status: DC | PRN

## 2013-04-14 MED ORDER — PROMETHAZINE HCL 25 MG/ML IJ SOLN
25.0000 mg | Freq: Once | INTRAMUSCULAR | Status: AC
  Administered 2013-04-14: 25 mg via INTRAVENOUS
  Filled 2013-04-14: qty 1

## 2013-04-14 NOTE — ED Provider Notes (Signed)
History     CSN: [id number]  Arrival date & time 04/14/13  1230   First MD Initiated Contact with Patient 04/14/13 1328      Chief Complaint  Patient presents with  . Abdominal Pain    (Consider location/radiation/quality/duration/timing/severity/associated sxs/prior treatment) HPI Comments: Pt states that she started having epigastric pain yesterday, with nausea and diarrhea:pt states that she was seen recently for similar symptoms:pt states that the she is scheduled for endoscopy, but not for another 2 weeks:pt states that she is also having lower abdominal pain but she is seeing her ob for that and that is not why she is here today  Patient is a 41 y.o. female presenting with abdominal pain. The history is provided by the patient.  Abdominal Pain Pain location:  Epigastric Pain quality: aching   Pain radiates to:  Does not radiate Pain severity:  Moderate Onset quality:  Gradual Duration:  1 day Timing:  Constant Progression:  Unchanged Chronicity:  New Relieved by:  Nothing Worsened by:  Nothing tried Ineffective treatments:  None tried Associated symptoms: diarrhea and nausea   Associated symptoms: no fever and no shortness of breath     Past Medical History  Diagnosis Date  . Hypertension   . GERD (gastroesophageal reflux disease)   . Ovarian cyst     Past Surgical History  Procedure Laterality Date  . Abdominal hysterectomy    . Cholecystectomy    . Breast surgery    . Cesarean section    . Knee surgery      No family history on file.  History  Substance Use Topics  . Smoking status: Never Smoker   . Smokeless tobacco: Not on file  . Alcohol Use: No     Comment: wine    OB History   Grav Para Term Preterm Abortions TAB SAB Ect Mult Living   2 2              Review of Systems  Constitutional: Negative for fever.  Respiratory: Negative for shortness of breath.   Cardiovascular: Negative.   Gastrointestinal: Positive for nausea, abdominal  pain and diarrhea.    Allergies  Darvocet; Morphine and related; Percocet; Vicodin; and Zofran  Home Medications   Current Outpatient Rx  Name  Route  Sig  Dispense  Refill  . atenolol (TENORMIN) 50 MG tablet   Oral   Take 50 mg by mouth daily.           Marland Kitchen esomeprazole (NEXIUM) 40 MG capsule   Oral   Take 40 mg by mouth daily.           . sodium chloride (OCEAN) 0.65 % nasal spray   Nasal   Place 1 spray into the nose daily as needed. Patient used this medication for nasal congestion.          Marland Kitchen zolpidem (AMBIEN) 5 MG tablet   Oral   Take 5 mg by mouth at bedtime as needed. For sleep           BP 142/90  Temp(Src) 98.4 F (36.9 C) (Oral)  Ht 5\' 2"  (1.575 m)  Wt 215 lb (97.523 kg)  BMI 39.31 kg/m2  SpO2 100%  Physical Exam  Nursing note and vitals reviewed. Constitutional: She is oriented to person, place, and time. She appears well-developed and well-nourished.  HENT:  Head: Normocephalic and atraumatic.  Cardiovascular: Normal rate and regular rhythm.   Pulmonary/Chest: Effort normal and breath sounds normal. She exhibits no  tenderness.  Abdominal: Soft. Bowel sounds are normal. There is no tenderness.  Musculoskeletal: Normal range of motion.  Neurological: She is alert and oriented to person, place, and time.  Skin: Skin is warm and dry.    ED Course  Procedures (including critical care time)  Labs Reviewed  BASIC METABOLIC PANEL - Abnormal; Notable for the following:    Glucose, Bld 116 (*)    BUN 5 (*)    GFR calc non Af Amer 78 (*)    All other components within normal limits  CBC WITH DIFFERENTIAL  TROPONIN I   Dg Chest 2 View  04/14/2013  *RADIOLOGY REPORT*  Clinical Data: Chest pain  CHEST - 2 VIEW  Comparison: March 22, 2013  Findings: Lungs clear.  Heart size and pulmonary vascularity are normal.  No adenopathy.  No bone lesions.  No pneumothorax.  IMPRESSION: No abnormality noted.   Original Report Authenticated By: Bretta Bang,  M.D.    Date: 04/14/2013  Rate: 108  Rhythm: sinus tachycardia  QRS Axis: normal  Intervals: normal  ST/T Wave abnormalities: normal  Conduction Disutrbances:none  Narrative Interpretation:   Old EKG Reviewed: unchanged     1. Chest pain   2. Nausea       MDM  Pt symptoms have completely resolved at this time:will send home with something for nausea:doubt acs pt is tachy:considered pe although        Teressa Lower, NP 04/14/13 1759

## 2013-04-14 NOTE — ED Notes (Signed)
Patient states she has a history of ovarian cyst and torsion, requiring a pain hysterectomy with right ovary removal.  States she was seen by her Dr. Lawerance Bach in [facility] and was given an ultrasound on Monday.  The ultrasound was inconclusive due to the large bowel gas.  Pt was given miralax and sent home to reschedule the ultrasound.  States she continues to have pain in the left mid abdomin.  Today she had developed mid chest burning pain, nausea and diarrhea.

## 2013-04-15 NOTE — ED Provider Notes (Signed)
Medical screening examination/treatment/procedure(s) were performed by non-physician practitioner and as supervising physician I was immediately available for consultation/collaboration.   Charles B. Bernette Mayers, MD 04/15/13 1404

## 2013-04-26 NOTE — MAU Note (Signed)
Chart audited for monthly QI 

## 2013-07-17 ENCOUNTER — Encounter (HOSPITAL_BASED_OUTPATIENT_CLINIC_OR_DEPARTMENT_OTHER): Payer: Self-pay | Admitting: *Deleted

## 2013-07-17 ENCOUNTER — Emergency Department (HOSPITAL_BASED_OUTPATIENT_CLINIC_OR_DEPARTMENT_OTHER)
Admission: EM | Admit: 2013-07-17 | Discharge: 2013-07-18 | Disposition: A | Discharge status: Home / Self Care | Payer: BC Managed Care – PPO | Attending: Emergency Medicine | Admitting: Emergency Medicine

## 2013-07-17 DIAGNOSIS — K219 Gastro-esophageal reflux disease without esophagitis: Secondary | ICD-10-CM | POA: Insufficient documentation

## 2013-07-17 DIAGNOSIS — Z9889 Other specified postprocedural states: Secondary | ICD-10-CM | POA: Insufficient documentation

## 2013-07-17 DIAGNOSIS — S39012A Strain of muscle, fascia and tendon of lower back, initial encounter: Secondary | ICD-10-CM

## 2013-07-17 DIAGNOSIS — S335XXA Sprain of ligaments of lumbar spine, initial encounter: Secondary | ICD-10-CM | POA: Insufficient documentation

## 2013-07-17 DIAGNOSIS — W010XXA Fall on same level from slipping, tripping and stumbling without subsequent striking against object, initial encounter: Secondary | ICD-10-CM | POA: Insufficient documentation

## 2013-07-17 DIAGNOSIS — R197 Diarrhea, unspecified: Secondary | ICD-10-CM | POA: Insufficient documentation

## 2013-07-17 DIAGNOSIS — Z90710 Acquired absence of both cervix and uterus: Secondary | ICD-10-CM | POA: Insufficient documentation

## 2013-07-17 DIAGNOSIS — Z9089 Acquired absence of other organs: Secondary | ICD-10-CM | POA: Insufficient documentation

## 2013-07-17 DIAGNOSIS — Y929 Unspecified place or not applicable: Secondary | ICD-10-CM | POA: Insufficient documentation

## 2013-07-17 DIAGNOSIS — R109 Unspecified abdominal pain: Secondary | ICD-10-CM

## 2013-07-17 DIAGNOSIS — I10 Essential (primary) hypertension: Secondary | ICD-10-CM | POA: Insufficient documentation

## 2013-07-17 DIAGNOSIS — R1110 Vomiting, unspecified: Secondary | ICD-10-CM | POA: Insufficient documentation

## 2013-07-17 DIAGNOSIS — Y9389 Activity, other specified: Secondary | ICD-10-CM | POA: Insufficient documentation

## 2013-07-17 DIAGNOSIS — Z79899 Other long term (current) drug therapy: Secondary | ICD-10-CM | POA: Insufficient documentation

## 2013-07-17 DIAGNOSIS — Z8742 Personal history of other diseases of the female genital tract: Secondary | ICD-10-CM | POA: Insufficient documentation

## 2013-07-17 LAB — BASIC METABOLIC PANEL
BUN: 14 mg/dL (ref 6–23)
Chloride: 102 mEq/L (ref 96–112)
Creatinine, Ser: 1.1 mg/dL (ref 0.50–1.10)
GFR calc Af Amer: 71 mL/min — ABNORMAL LOW (ref >90)

## 2013-07-17 LAB — CBC WITH DIFFERENTIAL/PLATELET
Basophils Relative: 0 % (ref 0–1)
Eosinophils Absolute: 0.1 10*3/uL (ref 0.0–0.7)
HCT: 38.2 % (ref 36.0–46.0)
Hemoglobin: 12.7 g/dL (ref 12.0–15.0)
MCH: 30 pg (ref 26.0–34.0)
MCHC: 33.2 g/dL (ref 30.0–36.0)
MCV: 90.1 fL (ref 78.0–100.0)
Monocytes Absolute: 0.9 10*3/uL (ref 0.1–1.0)
Monocytes Relative: 7 % (ref 3–12)
Neutro Abs: 6.6 10*3/uL (ref 1.7–7.7)

## 2013-07-17 MED ORDER — FENTANYL CITRATE 0.05 MG/ML IJ SOLN
100.0000 ug | Freq: Once | INTRAMUSCULAR | Status: AC
  Administered 2013-07-18: 100 ug via INTRAVENOUS
  Filled 2013-07-17: qty 2

## 2013-07-17 MED ORDER — ONDANSETRON HCL 4 MG/2ML IJ SOLN
4.0000 mg | Freq: Once | INTRAMUSCULAR | Status: DC
  Filled 2013-07-17: qty 2

## 2013-07-17 MED ORDER — SODIUM CHLORIDE 0.9 % IV BOLUS (SEPSIS)
1000.0000 mL | Freq: Once | INTRAVENOUS | Status: AC
  Administered 2013-07-18: 1000 mL via INTRAVENOUS

## 2013-07-17 NOTE — ED Provider Notes (Signed)
EKG Interpretation:  Date & Time: 07/17/2013 10:15 PM  Rate: 103  Rhythm: sinus tachycardia  QRS Axis: normal  Intervals: normal  ST/T Wave abnormalities: normal  Conduction Disutrbances:none  Narrative Interpretation:   Old EKG Reviewed: unchanged  \   Hanley Seamen, MD 07/17/13 2308

## 2013-07-17 NOTE — ED Notes (Signed)
Pt reports CP that started 2-3 hours PTA.  Reports vomiting x 2 hours.  Pt also reports falling PTA down stairs-reports back pain.  Denies LOC, denies hitting head.  Pt A/O x 4, no distress noted.

## 2013-07-17 NOTE — ED Provider Notes (Signed)
CSN: [id number]     Arrival date & time 07/17/13  2209 History  This chart was scribed for Hanley Seamen, MD by Bennett Scrape, ED Scribe. This patient was seen in room MH11/MH11 and the patient's care was started at 10:57 PM.   First MD Initiated Contact with Patient 07/17/13 2254     Chief Complaint  Patient presents with  . Abdominal Pain    The history is provided by the patient. No language interpreter was used.    HPI Comments: [name] is a 42 y.o. female who presents to the Emergency Department complaining of epigastric abdominal pain that radiated into the lower chest and LLQ abdominal pain that started 4 hours ago. The pains occurred at the same time and were aggravated with deep breathing and palpation of the LUQ. She rates her pain an 8.5 out of 10 currently and lists diarrhea and emesis that started shortly after the pain as associated symptoms. She denies having any prior episodes. She also c/o lower lumbar back pain that started after she lost her balance going down steps and landed on the area. She also reports experiencing intermittent left hand paresthesia since the fall which is a new symptom for her. She denies having numbness currently. She denies LOC, vaginal bleeding or discharge as associated symptoms.   Past Medical History  Diagnosis Date  . Hypertension   . GERD (gastroesophageal reflux disease)   . Ovarian cyst    Past Surgical History  Procedure Laterality Date  . Abdominal hysterectomy    . Cholecystectomy    . Breast surgery    . Cesarean section    . Knee surgery     History reviewed. No pertinent family history. History  Substance Use Topics  . Smoking status: Never Smoker   . Smokeless tobacco: Not on file  . Alcohol Use: No     Comment: wine   OB History   Grav Para Term Preterm Abortions TAB SAB Ect Mult Living   2 2             Review of Systems  A complete 10 system review of systems was obtained and all systems are negative  except as noted in the HPI and PMH.   Allergies  Darvocet and Zofran  Home Medications   Current Outpatient Rx  Name  Route  Sig  Dispense  Refill  . atenolol (TENORMIN) 50 MG tablet   Oral   Take 50 mg by mouth daily.           Marland Kitchen esomeprazole (NEXIUM) 40 MG capsule   Oral   Take 40 mg by mouth daily.           . promethazine (PHENERGAN) 25 MG tablet   Oral   Take 1 tablet (25 mg total) by mouth every 6 (six) hours as needed for nausea.   30 tablet   0   . sodium chloride (OCEAN) 0.65 % nasal spray   Nasal   Place 1 spray into the nose daily as needed. Patient used this medication for nasal congestion.          Marland Kitchen zolpidem (AMBIEN) 5 MG tablet   Oral   Take 5 mg by mouth at bedtime as needed. For sleep           Physical Exam  Nursing note and vitals reviewed.  Triage Vitals: BP 160/61  Pulse 103  Temp(Src) 98.9 F (37.2 C) (Oral)  Resp 18  Ht 5'  2" (1.575 m)  Wt 210 lb (95.255 kg)  BMI 38.4 kg/m2  SpO2 100%  General Appearance:    Alert, cooperative, no distress, appears stated age  Head:    Normocephalic, without obvious abnormality, atraumatic  Eyes:    PERRL, conjunctiva/corneas clear, EOM's intact, fundi    benign, both eyes     Nose:   Nares normal, septum midline, mucosa normal, no drainage    or sinus tenderness  Throat:   Lips, mucosa, and tongue normal; teeth and gums normal  Neck:   Supple, symmetrical, trachea midline, no adenopathy;    thyroid:  no enlargement/tenderness/nodules; no carotid   bruit or JVD  Back:     Symmetric, no curvature, ROM normal, no CVA tenderness  Lungs:     Clear to auscultation bilaterally, respirations unlabored  Chest Wall:    No tenderness or deformity   Heart:    Regular rate and rhythm, S1 and S2 normal, no murmur, rub   or gallop     Abdomen:     Soft, tenderness to the epigastrium and LLQ, bowel sounds are present but hypoactive, no masses, no organomegaly        Extremities:   Extremities normal,  atraumatic, no cyanosis or edema  Pulses:   2+ and symmetric all extremities  Skin:   Skin color, texture, turgor normal, no rashes or lesions     Neurologic:   CNII-XII intact, normal strength, sensation and reflexes    throughout   ED Course   Procedures (including critical care time)  DIAGNOSTIC STUDIES: Oxygen Saturation is 100% on room air, normal by my interpretation.    COORDINATION OF CARE: 11:03 PM-Discussed treatment plan which includes CT of abdomen, CBC panel, BMP and troponin with pt at bedside and pt agreed to plan.   No diagnosis found.  MDM   Nursing notes and vitals signs, including pulse oximetry, reviewed.  Summary of this visit's results, reviewed by myself:  Labs:  Results for orders placed during the hospital encounter of 07/17/13 (from the past 24 hour(s))  CBC WITH DIFFERENTIAL     Status: Abnormal   Collection Time    07/17/13 10:55 PM      Result Value Range   WBC 11.9 (*) 4.0 - 10.5 K/uL   RBC 4.24  3.87 - 5.11 MIL/uL   Hemoglobin 12.7  12.0 - 15.0 g/dL   HCT 16.1  09.6 - 04.5 %   MCV 90.1  78.0 - 100.0 fL   MCH 30.0  26.0 - 34.0 pg   MCHC 33.2  30.0 - 36.0 g/dL   RDW 40.9  81.1 - 91.4 %   Platelets 331  150 - 400 K/uL   Neutrophils Relative % 56  43 - 77 %   Neutro Abs 6.6  1.7 - 7.7 K/uL   Lymphocytes Relative 36  12 - 46 %   Lymphs Abs 4.3 (*) 0.7 - 4.0 K/uL   Monocytes Relative 7  3 - 12 %   Monocytes Absolute 0.9  0.1 - 1.0 K/uL   Eosinophils Relative 1  0 - 5 %   Eosinophils Absolute 0.1  0.0 - 0.7 K/uL   Basophils Relative 0  0 - 1 %   Basophils Absolute 0.0  0.0 - 0.1 K/uL  BASIC METABOLIC PANEL     Status: Abnormal   Collection Time    07/17/13 10:55 PM      Result Value Range   Sodium 138  135 - 145  mEq/L   Potassium 3.3 (*) 3.5 - 5.1 mEq/L   Chloride 102  96 - 112 mEq/L   CO2 24  19 - 32 mEq/L   Glucose, Bld 103 (*) 70 - 99 mg/dL   BUN 14  6 - 23 mg/dL   Creatinine, Ser 1.61  0.50 - 1.10 mg/dL   Calcium 9.6  8.4 - 09.6  mg/dL   GFR calc non Af Amer 61 (*) >90 mL/min   GFR calc Af Amer 71 (*) >90 mL/min  TROPONIN I     Status: None   Collection Time    07/17/13 10:55 PM      Result Value Range   Troponin I <0.30  <0.30 ng/mL  LIPASE, BLOOD     Status: None   Collection Time    07/17/13 10:55 PM      Result Value Range   Lipase 13  11 - 59 U/L  URINALYSIS, ROUTINE W REFLEX MICROSCOPIC     Status: None   Collection Time    07/18/13  1:21 AM      Result Value Range   Color, Urine YELLOW  YELLOW   APPearance CLEAR  CLEAR   Specific Gravity, Urine 1.022  1.005 - 1.030   pH 6.0  5.0 - 8.0   Glucose, UA NEGATIVE  NEGATIVE mg/dL   Hgb urine dipstick NEGATIVE  NEGATIVE   Bilirubin Urine NEGATIVE  NEGATIVE   Ketones, ur NEGATIVE  NEGATIVE mg/dL   Protein, ur NEGATIVE  NEGATIVE mg/dL   Urobilinogen, UA 0.2  0.0 - 1.0 mg/dL   Nitrite NEGATIVE  NEGATIVE   Leukocytes, UA NEGATIVE  NEGATIVE    Imaging Studies: Ct Abdomen Pelvis W Contrast  07/18/2013   *RADIOLOGY REPORT*  Clinical Data: Epigastric pain that radiates of lower chest and left lower quadrant.  Diarrhea and vomiting.  CT ABDOMEN AND PELVIS WITH CONTRAST  Technique:  Multidetector CT imaging of the abdomen and pelvis was performed following the standard protocol during bolus administration of intravenous contrast.  Contrast:  100 ml Omnipaque-300.  Comparison: 01/28/2012.  Findings:  BODY WALL: Unremarkable.  LOWER CHEST:  Mediastinum: Unremarkable.  Lungs/pleura: No consolidation.  ABDOMEN/PELVIS:  Liver: No focal abnormality.  Biliary: Cholecystectomy.  Pancreas: Unremarkable.  Spleen: Unremarkable.  Adrenals: Unremarkable.  Kidneys and ureters: No hydronephrosis or stone. 2 cm left upper pole cyst.  Bladder: Unremarkable.  Bowel: No obstruction. Normal appendix.  Retroperitoneum: No mass or adenopathy.  Peritoneum: No free fluid or gas.  Reproductive: Hysterectomy.  Crenulated enhancing structure within the medial aspect right ovary is consistent with  a corpus luteum.  Vascular: No acute abnormality.  OSSEOUS: No acute abnormalities. Lower lumbar facet osteoarthritis.  IMPRESSION: Negative for acute intra-abdominal abnormality.   Original Report Authenticated By: Tiburcio Pea   3:08 AM Patient still having epigastric and left lower quadrant tenderness. She has a history of GERD. She has stopped taking her Nexium because of been causing constipation. She has taken Protonix in the past without difficulty he would like to retry that. Also she requests something for pain as she states she is having significant pain in her back now as a result of her fall. Since she is allergic to Zofran we will prescribe Reglan for nausea.   I personally performed the services described in this documentation, which was scribed in my presence.  The recorded information has been reviewed and is accurate.   Hanley Seamen, MD 07/18/13 [phone]

## 2013-07-18 ENCOUNTER — Emergency Department (HOSPITAL_BASED_OUTPATIENT_CLINIC_OR_DEPARTMENT_OTHER): Payer: BC Managed Care – PPO

## 2013-07-18 LAB — URINALYSIS, ROUTINE W REFLEX MICROSCOPIC
Glucose, UA: NEGATIVE mg/dL
Hgb urine dipstick: NEGATIVE
Protein, ur: NEGATIVE mg/dL
Specific Gravity, Urine: 1.022 (ref 1.005–1.030)

## 2013-07-18 MED ORDER — HYDROCODONE-ACETAMINOPHEN 5-325 MG PO TABS: 1.0000 | ORAL_TABLET | Freq: Four times a day (QID) | ORAL | Status: DC | PRN

## 2013-07-18 MED ORDER — DIPHENHYDRAMINE HCL 50 MG/ML IJ SOLN: 25.0000 mg | Freq: Once | INTRAMUSCULAR | Status: AC

## 2013-07-18 MED ORDER — METOCLOPRAMIDE HCL 5 MG/ML IJ SOLN
10.0000 mg | Freq: Once | INTRAMUSCULAR | Status: AC
  Administered 2013-07-18: 10 mg via INTRAVENOUS

## 2013-07-18 MED ORDER — PANTOPRAZOLE SODIUM 40 MG PO TBEC: 40.0000 mg | DELAYED_RELEASE_TABLET | Freq: Every day | ORAL | Status: DC

## 2013-07-18 MED ORDER — PANTOPRAZOLE SODIUM 40 MG IV SOLR
40.0000 mg | Freq: Once | INTRAVENOUS | Status: AC
  Administered 2013-07-18: 40 mg via INTRAVENOUS
  Filled 2013-07-18: qty 40

## 2013-07-18 MED ORDER — IOHEXOL 300 MG/ML  SOLN
50.0000 mL | Freq: Once | INTRAMUSCULAR | Status: AC | PRN
  Administered 2013-07-18: 50 mL via ORAL

## 2013-07-18 MED ORDER — DIPHENHYDRAMINE HCL 50 MG/ML IJ SOLN
INTRAMUSCULAR | Status: AC
  Administered 2013-07-18: 25 mg via INTRAVENOUS
  Filled 2013-07-18: qty 1

## 2013-07-18 MED ORDER — METOCLOPRAMIDE HCL 10 MG PO TABS: 10.0000 mg | ORAL_TABLET | Freq: Four times a day (QID) | ORAL | Status: DC | PRN

## 2013-07-18 MED ORDER — IOHEXOL 300 MG/ML  SOLN
100.0000 mL | Freq: Once | INTRAMUSCULAR | Status: AC | PRN
  Administered 2013-07-18: 100 mL via INTRAVENOUS

## 2013-07-18 MED ORDER — METOCLOPRAMIDE HCL 5 MG/ML IJ SOLN
INTRAMUSCULAR | Status: AC
  Filled 2013-07-18: qty 2

## 2013-07-18 MED ORDER — FENTANYL CITRATE 0.05 MG/ML IJ SOLN
50.0000 ug | Freq: Once | INTRAMUSCULAR | Status: AC
  Administered 2013-07-18: 50 ug via INTRAVENOUS
  Filled 2013-07-18: qty 2

## 2013-10-03 ENCOUNTER — Emergency Department (HOSPITAL_BASED_OUTPATIENT_CLINIC_OR_DEPARTMENT_OTHER): Payer: BC Managed Care – PPO

## 2013-10-03 ENCOUNTER — Encounter (HOSPITAL_BASED_OUTPATIENT_CLINIC_OR_DEPARTMENT_OTHER): Payer: Self-pay | Admitting: Emergency Medicine

## 2013-10-03 ENCOUNTER — Emergency Department (HOSPITAL_BASED_OUTPATIENT_CLINIC_OR_DEPARTMENT_OTHER)
Admission: EM | Admit: 2013-10-03 | Discharge: 2013-10-03 | Disposition: A | Discharge status: Home / Self Care | Payer: BC Managed Care – PPO | Attending: Emergency Medicine | Admitting: Emergency Medicine

## 2013-10-03 DIAGNOSIS — R079 Chest pain, unspecified: Secondary | ICD-10-CM

## 2013-10-03 DIAGNOSIS — Z862 Personal history of diseases of the blood and blood-forming organs and certain disorders involving the immune mechanism: Secondary | ICD-10-CM | POA: Insufficient documentation

## 2013-10-03 DIAGNOSIS — Z8742 Personal history of other diseases of the female genital tract: Secondary | ICD-10-CM | POA: Insufficient documentation

## 2013-10-03 DIAGNOSIS — I10 Essential (primary) hypertension: Secondary | ICD-10-CM | POA: Insufficient documentation

## 2013-10-03 DIAGNOSIS — F411 Generalized anxiety disorder: Secondary | ICD-10-CM | POA: Insufficient documentation

## 2013-10-03 DIAGNOSIS — R0602 Shortness of breath: Secondary | ICD-10-CM | POA: Insufficient documentation

## 2013-10-03 DIAGNOSIS — R197 Diarrhea, unspecified: Secondary | ICD-10-CM | POA: Insufficient documentation

## 2013-10-03 DIAGNOSIS — K219 Gastro-esophageal reflux disease without esophagitis: Secondary | ICD-10-CM | POA: Insufficient documentation

## 2013-10-03 DIAGNOSIS — R072 Precordial pain: Secondary | ICD-10-CM | POA: Insufficient documentation

## 2013-10-03 DIAGNOSIS — Z79899 Other long term (current) drug therapy: Secondary | ICD-10-CM | POA: Insufficient documentation

## 2013-10-03 DIAGNOSIS — Z8639 Personal history of other endocrine, nutritional and metabolic disease: Secondary | ICD-10-CM | POA: Insufficient documentation

## 2013-10-03 DIAGNOSIS — R112 Nausea with vomiting, unspecified: Secondary | ICD-10-CM | POA: Insufficient documentation

## 2013-10-03 HISTORY — DX: Disorder of thyroid, unspecified: E07.9

## 2013-10-03 LAB — COMPREHENSIVE METABOLIC PANEL
BUN: 11 mg/dL (ref 6–23)
CO2: 23 mEq/L (ref 19–32)
Chloride: 99 mEq/L (ref 96–112)
Creatinine, Ser: 1 mg/dL (ref 0.50–1.10)
GFR calc non Af Amer: 68 mL/min — ABNORMAL LOW (ref >90)
Glucose, Bld: 106 mg/dL — ABNORMAL HIGH (ref 70–99)
Total Bilirubin: 0.5 mg/dL (ref 0.3–1.2)

## 2013-10-03 LAB — CBC
HCT: 40.8 % (ref 36.0–46.0)
Hemoglobin: 13.8 g/dL (ref 12.0–15.0)
MCV: 89.5 fL (ref 78.0–100.0)
RBC: 4.56 MIL/uL (ref 3.87–5.11)
WBC: 11.1 10*3/uL — ABNORMAL HIGH (ref 4.0–10.5)

## 2013-10-03 LAB — URINALYSIS, ROUTINE W REFLEX MICROSCOPIC
Hgb urine dipstick: NEGATIVE
Ketones, ur: NEGATIVE mg/dL
Nitrite: NEGATIVE
Protein, ur: NEGATIVE mg/dL
Urobilinogen, UA: 0.2 mg/dL (ref 0.0–1.0)

## 2013-10-03 LAB — URINE MICROSCOPIC-ADD ON

## 2013-10-03 LAB — TROPONIN I
Troponin I: <0.3 ng/mL (ref <0.30)
Troponin I: <0.3 ng/mL (ref <0.30)

## 2013-10-03 MED ORDER — ACETAMINOPHEN 325 MG PO TABS
650.0000 mg | ORAL_TABLET | Freq: Once | ORAL | Status: AC
  Administered 2013-10-03: 650 mg via ORAL
  Filled 2013-10-03: qty 2

## 2013-10-03 MED ORDER — HYDROMORPHONE HCL PF 1 MG/ML IJ SOLN
1.0000 mg | Freq: Once | INTRAMUSCULAR | Status: AC
  Administered 2013-10-03: 1 mg via INTRAVENOUS

## 2013-10-03 MED ORDER — HYDROMORPHONE HCL PF 1 MG/ML IJ SOLN
INTRAMUSCULAR | Status: AC
  Filled 2013-10-03: qty 1

## 2013-10-03 MED ORDER — PROMETHAZINE HCL 25 MG/ML IJ SOLN
INTRAMUSCULAR | Status: AC
  Filled 2013-10-03: qty 1

## 2013-10-03 MED ORDER — PROMETHAZINE HCL 25 MG/ML IJ SOLN
12.5000 mg | Freq: Once | INTRAMUSCULAR | Status: AC
  Administered 2013-10-03: 20:00:00 via INTRAVENOUS

## 2013-10-03 MED ORDER — ASPIRIN 81 MG PO CHEW
324.0000 mg | CHEWABLE_TABLET | Freq: Once | ORAL | Status: AC
  Administered 2013-10-03: 324 mg via ORAL
  Filled 2013-10-03: qty 4

## 2013-10-03 MED ORDER — PROMETHAZINE HCL 25 MG PO TABS: 25.0000 mg | ORAL_TABLET | Freq: Four times a day (QID) | ORAL | Status: DC | PRN

## 2013-10-03 MED ORDER — SODIUM CHLORIDE 0.9 % IV BOLUS (SEPSIS)
1000.0000 mL | Freq: Once | INTRAVENOUS | Status: AC
  Administered 2013-10-03: 1000 mL via INTRAVENOUS

## 2013-10-03 MED ORDER — PROMETHAZINE HCL 25 MG PO TABS
25.0000 mg | ORAL_TABLET | Freq: Once | ORAL | Status: AC
  Administered 2013-10-03: 25 mg via ORAL
  Filled 2013-10-03: qty 1

## 2013-10-03 MED ORDER — IOHEXOL 350 MG/ML SOLN
80.0000 mL | Freq: Once | INTRAVENOUS | Status: AC | PRN
  Administered 2013-10-03: 80 mL via INTRAVENOUS

## 2013-10-03 MED ORDER — LORAZEPAM 1 MG PO TABS
1.0000 mg | ORAL_TABLET | Freq: Once | ORAL | Status: AC
  Administered 2013-10-03: 1 mg via ORAL
  Filled 2013-10-03: qty 1

## 2013-10-03 MED ORDER — PROMETHAZINE HCL 25 MG/ML IJ SOLN
12.5000 mg | INTRAMUSCULAR | Status: DC | PRN
  Administered 2013-10-03: 12.5 mg via INTRAVENOUS
  Filled 2013-10-03: qty 1

## 2013-10-03 MED ORDER — HYDROMORPHONE HCL PF 1 MG/ML IJ SOLN
1.0000 mg | Freq: Once | INTRAMUSCULAR | Status: AC
  Administered 2013-10-03: 1 mg via INTRAVENOUS
  Filled 2013-10-03: qty 1

## 2013-10-03 MED ORDER — GI COCKTAIL ~~LOC~~
ORAL | Status: AC
  Filled 2013-10-03: qty 30

## 2013-10-03 MED ORDER — NITROGLYCERIN 0.4 MG SL SUBL
0.4000 mg | SUBLINGUAL_TABLET | SUBLINGUAL | Status: AC | PRN
  Administered 2013-10-03 (×3): 0.4 mg via SUBLINGUAL
  Filled 2013-10-03 (×2): qty 25

## 2013-10-03 MED ORDER — GI COCKTAIL ~~LOC~~
30.0000 mL | Freq: Once | ORAL | Status: AC
  Administered 2013-10-03: 30 mL via ORAL

## 2013-10-03 NOTE — ED Provider Notes (Signed)
7:30 PM Care transferred from Jimmye Norman, PA, at 4pm on 42 y.o. female with several days of intermittent n/v d/a, now with sharp central CP since last PM, worse w/ deep breathing. Pt has had neg trop x2, but is persistently tachycardic.  I feel PE must be ruled out. CTA chest ordered, which was negative for PE or pna. HR improved after 2L NS.  Delta trop x3 negative. Doubt cardiac cause of CP and feel is is more likely related to acute viral illness.  Return precautions given for new or worsening symptoms, pt d/c according to [facility].   1. Chest pain       Shanna Cisco, MD 10/04/13 1227

## 2013-10-03 NOTE — ED Notes (Signed)
Patient transported to CT 

## 2013-10-03 NOTE — ED Provider Notes (Signed)
CSN: [id number]     Arrival date & time 10/03/13  1352 History   First MD Initiated Contact with Patient 10/03/13 1356     Chief Complaint  Patient presents with  . Chest Pain   (Consider location/radiation/quality/duration/timing/severity/associated sxs/prior Treatment) Patient is a 42 y.o. female presenting with chest pain. The history is provided by the patient. No language interpreter was used.  Chest Pain Pain location:  Substernal area Pain quality: pressure   Pain radiates to the back: no   Pain severity:  Severe Onset quality:  Sudden Duration:  1 hour Progression:  Worsening Chronicity:  Recurrent Relieved by:  None tried Worsened by:  Deep breathing Associated symptoms: anxiety, nausea, shortness of breath and vomiting     Past Medical History  Diagnosis Date  . Hypertension   . GERD (gastroesophageal reflux disease)   . Ovarian cyst   . Thyroid disease    Past Surgical History  Procedure Laterality Date  . Abdominal hysterectomy    . Cholecystectomy    . Breast surgery    . Cesarean section    . Knee surgery    . Esophageal dilation     No family history on file. History  Substance Use Topics  . Smoking status: Never Smoker   . Smokeless tobacco: Not on file  . Alcohol Use: No     Comment: wine   OB History   Grav Para Term Preterm Abortions TAB SAB Ect Mult Living   2 2             Review of Systems  Respiratory: Positive for shortness of breath.   Cardiovascular: Positive for chest pain.  Gastrointestinal: Positive for nausea, vomiting and diarrhea.  Psychiatric/Behavioral: The patient is nervous/anxious.   All other systems reviewed and are negative.    Allergies  Darvocet and Zofran  Home Medications   Current Outpatient Rx  Name  Route  Sig  Dispense  Refill  . atenolol (TENORMIN) 50 MG tablet   Oral   Take 50 mg by mouth daily.           Marland Kitchen esomeprazole (NEXIUM) 40 MG capsule   Oral   Take 40 mg by mouth daily.            Marland Kitchen HYDROcodone-acetaminophen (NORCO/VICODIN) 5-325 MG per tablet   Oral   Take 1-2 tablets by mouth every 6 (six) hours as needed for pain.   20 tablet   0   . metoCLOPramide (REGLAN) 10 MG tablet   Oral   Take 1 tablet (10 mg total) by mouth every 6 (six) hours as needed (nausea/headache).   10 tablet   0   . pantoprazole (PROTONIX) 40 MG tablet   Oral   Take 1 tablet (40 mg total) by mouth daily.   30 tablet   0   . promethazine (PHENERGAN) 25 MG tablet   Oral   Take 1 tablet (25 mg total) by mouth every 6 (six) hours as needed for nausea.   30 tablet   0   . sodium chloride (OCEAN) 0.65 % nasal spray   Nasal   Place 1 spray into the nose daily as needed. Patient used this medication for nasal congestion.          Marland Kitchen zolpidem (AMBIEN) 5 MG tablet   Oral   Take 5 mg by mouth at bedtime as needed. For sleep          BP 145/97  Pulse 142  Temp(Src)  99.3 F (37.4 C) (Oral)  Resp 30  SpO2 100% Physical Exam  Nursing note and vitals reviewed. Constitutional: She is oriented to person, place, and time. She appears well-developed and well-nourished.  HENT:  Head: Normocephalic.  Eyes: Pupils are equal, round, and reactive to light.  Neck: Normal range of motion.  Cardiovascular: Normal heart sounds and intact distal pulses.   Pulmonary/Chest: Effort normal and breath sounds normal.  Abdominal: Soft. Bowel sounds are normal.  Musculoskeletal: She exhibits no edema and no tenderness.  Neurological: She is alert and oriented to person, place, and time.  Skin: Skin is warm and dry.  Psychiatric: Her mood appears anxious. Her speech is rapid and/or pressured.    ED Course  Procedures (including critical care time) Labs Review Labs Reviewed  CBC  COMPREHENSIVE METABOLIC PANEL  URINALYSIS, ROUTINE W REFLEX MICROSCOPIC   Imaging Review No results found.  EKG Interpretation   None      Date: 10/03/2013  Rate: 143  Rhythm: sinus tachycardia  QRS Axis:  normal  Intervals: normal  ST/T Wave abnormalities: nonspecific ST/T changes  Conduction Disutrbances:none  Narrative Interpretation: Sinus tachycardia  Old EKG Reviewed: unchanged(other than tachycardia today, is otherwise unchanged)  Radiology and lab results reviewed, shared with patient, are reassuring.  Chest discomfort currently resolved.  Patient resting comfortably at present, anxiety and hyperventilation resolved. Initial troponin negative, ECG without indication of ischemia.  No cardiac history, but has esophageal stricture and spasms, followed by GI at [facility].  Will cycle troponins, anticipate discharge home with PCP follow-up.  Repeat troponins pending. Patient signed out to Dr. Micheline Maze at shift change. MDM   Chest pain. Anxiety.    Jimmye Norman, NP 10/03/13 [phone]

## 2013-10-03 NOTE — ED Notes (Signed)
No improvement after dilaudid and phenergan

## 2013-10-03 NOTE — ED Notes (Addendum)
Patient states she has a three day history of nausea, vomiting, diarrhea and chest pain.  Patient is anxious and hyperventilating.  States she has had chest pain in the past, but not like this. States one hour pta, she started to feel really bad and the chest pain worsened.

## 2013-10-03 NOTE — ED Notes (Signed)
RN Sam at bedside to draw troponin and encourage Pt. To give urine sample.

## 2013-10-08 NOTE — ED Provider Notes (Signed)
Medical screening examination/treatment/procedure(s) were performed by non-physician practitioner and as supervising physician I was immediately available for consultation/collaboration.  Trellis Vanoverbeke K Linker, MD 10/08/13 0909 

## 2014-01-08 ENCOUNTER — Encounter (HOSPITAL_BASED_OUTPATIENT_CLINIC_OR_DEPARTMENT_OTHER): Payer: Self-pay | Admitting: Emergency Medicine

## 2014-01-08 ENCOUNTER — Emergency Department (HOSPITAL_BASED_OUTPATIENT_CLINIC_OR_DEPARTMENT_OTHER)
Admission: EM | Admit: 2014-01-08 | Discharge: 2014-01-08 | Disposition: A | Discharge status: Home / Self Care | Payer: BC Managed Care – PPO | Attending: Emergency Medicine | Admitting: Emergency Medicine

## 2014-01-08 ENCOUNTER — Emergency Department (HOSPITAL_BASED_OUTPATIENT_CLINIC_OR_DEPARTMENT_OTHER): Payer: BC Managed Care – PPO

## 2014-01-08 DIAGNOSIS — R109 Unspecified abdominal pain: Secondary | ICD-10-CM

## 2014-01-08 DIAGNOSIS — Z9089 Acquired absence of other organs: Secondary | ICD-10-CM | POA: Insufficient documentation

## 2014-01-08 DIAGNOSIS — Z90710 Acquired absence of both cervix and uterus: Secondary | ICD-10-CM | POA: Insufficient documentation

## 2014-01-08 DIAGNOSIS — Z9889 Other specified postprocedural states: Secondary | ICD-10-CM | POA: Insufficient documentation

## 2014-01-08 DIAGNOSIS — K219 Gastro-esophageal reflux disease without esophagitis: Secondary | ICD-10-CM | POA: Insufficient documentation

## 2014-01-08 DIAGNOSIS — I10 Essential (primary) hypertension: Secondary | ICD-10-CM | POA: Insufficient documentation

## 2014-01-08 DIAGNOSIS — R079 Chest pain, unspecified: Secondary | ICD-10-CM | POA: Insufficient documentation

## 2014-01-08 DIAGNOSIS — Z8742 Personal history of other diseases of the female genital tract: Secondary | ICD-10-CM | POA: Insufficient documentation

## 2014-01-08 DIAGNOSIS — Z862 Personal history of diseases of the blood and blood-forming organs and certain disorders involving the immune mechanism: Secondary | ICD-10-CM | POA: Insufficient documentation

## 2014-01-08 DIAGNOSIS — Z8639 Personal history of other endocrine, nutritional and metabolic disease: Secondary | ICD-10-CM | POA: Insufficient documentation

## 2014-01-08 DIAGNOSIS — R1032 Left lower quadrant pain: Secondary | ICD-10-CM | POA: Insufficient documentation

## 2014-01-08 DIAGNOSIS — Z79899 Other long term (current) drug therapy: Secondary | ICD-10-CM | POA: Insufficient documentation

## 2014-01-08 LAB — COMPREHENSIVE METABOLIC PANEL
ALT: 19 U/L (ref 0–35)
AST: 17 U/L (ref 0–37)
Albumin: 3.8 g/dL (ref 3.5–5.2)
Alkaline Phosphatase: 51 U/L (ref 39–117)
BUN: 12 mg/dL (ref 6–23)
CO2: 26 meq/L (ref 19–32)
CREATININE: 1 mg/dL (ref 0.50–1.10)
Calcium: 9.3 mg/dL (ref 8.4–10.5)
Chloride: 101 mEq/L (ref 96–112)
GFR calc Af Amer: 79 mL/min — ABNORMAL LOW (ref >90)
GFR, EST NON AFRICAN AMERICAN: 68 mL/min — AB (ref >90)
Glucose, Bld: 91 mg/dL (ref 70–99)
Potassium: 3.8 mEq/L (ref 3.7–5.3)
SODIUM: 140 meq/L (ref 137–147)
TOTAL PROTEIN: 6.7 g/dL (ref 6.0–8.3)
Total Bilirubin: 0.3 mg/dL (ref 0.3–1.2)

## 2014-01-08 LAB — URINALYSIS, ROUTINE W REFLEX MICROSCOPIC
BILIRUBIN URINE: NEGATIVE
Glucose, UA: NEGATIVE mg/dL
Hgb urine dipstick: NEGATIVE
Ketones, ur: NEGATIVE mg/dL
Leukocytes, UA: NEGATIVE
NITRITE: NEGATIVE
Protein, ur: NEGATIVE mg/dL
SPECIFIC GRAVITY, URINE: 1.023 (ref 1.005–1.030)
Urobilinogen, UA: 0.2 mg/dL (ref 0.0–1.0)
pH: 6.5 (ref 5.0–8.0)

## 2014-01-08 LAB — CBC WITH DIFFERENTIAL/PLATELET
BASOS ABS: 0 10*3/uL (ref 0.0–0.1)
BASOS PCT: 0 % (ref 0–1)
Eosinophils Absolute: 0.1 10*3/uL (ref 0.0–0.7)
Eosinophils Relative: 1 % (ref 0–5)
HCT: 37.4 % (ref 36.0–46.0)
Hemoglobin: 12.2 g/dL (ref 12.0–15.0)
LYMPHS PCT: 38 % (ref 12–46)
Lymphs Abs: 2.6 10*3/uL (ref 0.7–4.0)
MCH: 30.2 pg (ref 26.0–34.0)
MCHC: 32.6 g/dL (ref 30.0–36.0)
MCV: 92.6 fL (ref 78.0–100.0)
Monocytes Absolute: 0.6 10*3/uL (ref 0.1–1.0)
Monocytes Relative: 9 % (ref 3–12)
Neutro Abs: 3.5 10*3/uL (ref 1.7–7.7)
Neutrophils Relative %: 52 % (ref 43–77)
PLATELETS: 290 10*3/uL (ref 150–400)
RBC: 4.04 MIL/uL (ref 3.87–5.11)
RDW: 12.7 % (ref 11.5–15.5)
WBC: 6.8 10*3/uL (ref 4.0–10.5)

## 2014-01-08 MED ORDER — IOHEXOL 300 MG/ML  SOLN
100.0000 mL | Freq: Once | INTRAMUSCULAR | Status: AC | PRN
  Administered 2014-01-08: 100 mL via INTRAVENOUS

## 2014-01-08 MED ORDER — PROMETHAZINE HCL 25 MG/ML IJ SOLN
25.0000 mg | Freq: Once | INTRAMUSCULAR | Status: AC
  Administered 2014-01-08: 25 mg via INTRAVENOUS
  Filled 2014-01-08: qty 1

## 2014-01-08 MED ORDER — IOHEXOL 300 MG/ML  SOLN
50.0000 mL | Freq: Once | INTRAMUSCULAR | Status: AC | PRN
  Administered 2014-01-08: 50 mL via ORAL

## 2014-01-08 MED ORDER — PROMETHAZINE HCL 25 MG PO TABS: 25.0000 mg | ORAL_TABLET | Freq: Four times a day (QID) | ORAL | Status: DC | PRN

## 2014-01-08 MED ORDER — OXYCODONE-ACETAMINOPHEN 5-325 MG PO TABS: 1.0000 | ORAL_TABLET | ORAL | Status: DC | PRN

## 2014-01-08 MED ORDER — ONDANSETRON 8 MG PO TBDP: 8.0000 mg | ORAL_TABLET | Freq: Three times a day (TID) | ORAL | Status: DC | PRN

## 2014-01-08 MED ORDER — HYDROMORPHONE HCL PF 1 MG/ML IJ SOLN
1.0000 mg | Freq: Once | INTRAMUSCULAR | Status: AC
  Administered 2014-01-08: 1 mg via INTRAVENOUS
  Filled 2014-01-08: qty 1

## 2014-01-08 NOTE — ED Provider Notes (Signed)
CSN: [id number]     Arrival date & time 01/08/14  1419 History   First MD Initiated Contact with Patient 01/08/14 1723    This chart was scribed for Hilario Quarry, MD by Ladona Ridgel Day, ED scribe. This patient was seen in room MH10/MH10 and the patient's care was started at 1723.  Chief Complaint  Patient presents with  . Abdominal Pain  . Nausea   The history is provided by the patient. No language interpreter was used.   HPI Comments: [name] is a 43 y.o. female who presents to the Emergency Department complaining of sudden onset, sharp/stabbing, non radiating, constant, 8/10, severe abdominal pain, onset few hours ago while at rest at home several hours after breakfast this AM (ate grits and toast, nothing to eat since breakfast). Similar previous episode when she had a cyst on her ovary. She reports associated nausea, x2 episodes of loose stool and x2 emesis episodes. Denies any hematuria or dysuria. Tried tylenol w/no relief from pain. Similar episode 6 months ago and also similar to her previous "torsion of ovarian cyst" episode. She has had several previous workups for same problem and she reports that she cannot remember details of those visits or what was checked or normal for her.   She also c/o sharp episodes of CP onset 4 hours ago after her abdominal pain began. No cough or SOB. Nothing makes worse or better. Similar CP w/cardiac w/u few months ago and was told GERD.  Atenolol for HTN and nexium for GERD. Hx of hysterectomy (Uterus removed) and cholecystectomy PCP Dr. Lawerance Bach at [facility]  Past Medical History  Diagnosis Date  . Hypertension   . GERD (gastroesophageal reflux disease)   . Ovarian cyst   . Thyroid disease    Past Surgical History  Procedure Laterality Date  . Abdominal hysterectomy    . Cholecystectomy    . Breast surgery    . Cesarean section    . Knee surgery    . Esophageal dilation     No family history on file. History  Substance Use Topics   . Smoking status: Never Smoker   . Smokeless tobacco: Not on file  . Alcohol Use: No     Comment: wine   OB History   Grav Para Term Preterm Abortions TAB SAB Ect Mult Living   2 2             Review of Systems  Constitutional: Negative for fever and chills.  HENT: Negative for congestion and rhinorrhea.   Respiratory: Negative for cough and shortness of breath.   Cardiovascular: Positive for chest pain.  Gastrointestinal: Positive for nausea, vomiting and abdominal pain.  Genitourinary: Negative for dysuria and hematuria.  Musculoskeletal: Negative for back pain.  Skin: Negative for color change and rash.  Neurological: Negative for syncope.  All other systems reviewed and are negative.   A complete 10 system review of systems was obtained and all systems are negative except as noted in the HPI and PMH.   Allergies  Darvocet and Zofran  Home Medications   Current Outpatient Rx  Name  Route  Sig  Dispense  Refill  . atenolol (TENORMIN) 50 MG tablet   Oral   Take 50 mg by mouth daily.           Marland Kitchen esomeprazole (NEXIUM) 40 MG capsule   Oral   Take 40 mg by mouth daily.           . pantoprazole (  PROTONIX) 40 MG tablet   Oral   Take 1 tablet (40 mg total) by mouth daily.   30 tablet   0   . promethazine (PHENERGAN) 25 MG tablet   Oral   Take 1 tablet (25 mg total) by mouth every 6 (six) hours as needed for nausea.   30 tablet   0   . sodium chloride (OCEAN) 0.65 % nasal spray   Nasal   Place 1 spray into the nose daily as needed. Patient used this medication for nasal congestion.          Marland Kitchen. zolpidem (AMBIEN) 5 MG tablet   Oral   Take 5 mg by mouth at bedtime as needed. For sleep          Triage Vitals: BP 128/61  Pulse 92  Temp(Src) 98.7 F (37.1 C) (Oral)  Resp 18  SpO2 99% Physical Exam  Nursing note and vitals reviewed. Constitutional: She is oriented to person, place, and time. She appears well-developed and well-nourished. No distress.   HENT:  Head: Normocephalic and atraumatic.  Right Ear: External ear normal.  Left Ear: External ear normal.  Nose: Nose normal.  Mouth/Throat: Oropharynx is clear and moist. No oropharyngeal exudate.  Eyes: Conjunctivae and EOM are normal. Pupils are equal, round, and reactive to light. Right eye exhibits no discharge. Left eye exhibits no discharge.  Neck: Normal range of motion.  Cardiovascular: Normal rate, regular rhythm and normal heart sounds.   No murmur heard. Pulmonary/Chest: Effort normal. No respiratory distress.  Musculoskeletal: Normal range of motion. She exhibits no edema and no tenderness.  No CVA tenderness  Neurological: She is alert and oriented to person, place, and time.  Skin: Skin is warm and dry.  Psychiatric: She has a normal mood and affect. Thought content normal.   ED Course  Procedures (including critical care time) DIAGNOSTIC STUDIES: Oxygen Saturation is 99% on room air, normal by my interpretation.    COORDINATION OF CARE: At 525 PM Discussed treatment plan with patient which includes blood work, UA, EKG. Patient agrees.   Labs Review Labs Reviewed  COMPREHENSIVE METABOLIC PANEL - Abnormal; Notable for the following:    GFR calc non Af Amer 68 (*)    GFR calc Af Amer 79 (*)    All other components within normal limits  URINALYSIS, ROUTINE W REFLEX MICROSCOPIC - Abnormal; Notable for the following:    APPearance CLOUDY (*)    All other components within normal limits  CBC WITH DIFFERENTIAL   Imaging Review Ct Abdomen Pelvis W Contrast  01/08/2014   CLINICAL DATA:  Sudden onset of constant severe abdominal pain. Nausea. Two episodes of loose stools and 2 times emesis.  EXAM: CT ABDOMEN AND PELVIS WITH CONTRAST  TECHNIQUE: Multidetector CT imaging of the abdomen and pelvis was performed using the standard protocol following bolus administration of intravenous contrast.  CONTRAST:  50mL OMNIPAQUE IOHEXOL 300 MG/ML SOLN, 100mL OMNIPAQUE IOHEXOL 300  MG/ML SOLN  COMPARISON:  DG ABDOMEN 2V dated 10/04/2013; CT ABD/PELVIS W CM dated 07/18/2013  FINDINGS: Clear lung bases. Mild cardiomegaly. Fluid level in the lower thoracic esophagus.  Minimal motion degradation. Normal liver, spleen, stomach, pancreas. Cholecystectomy, without biliary ductal dilatation. Normal adrenal glands and right kidney. A 12 mm interpolar left renal cyst or minimally complex cyst.  No retroperitoneal or retrocrural adenopathy. Colonic stool burden suggests constipation. Normal terminal ileum and appendix. Normal small bowel without abdominal ascites. No pelvic adenopathy. Normal urinary bladder. Hysterectomy. Right ovary upper  normal in size without dominant mass. No left ovarian mass. Trace free pelvic fluid is likely physiologic.  No acute osseous abnormality. Disc bulge at the lumbosacral junction.  IMPRESSION: 1. Mild motion degraded exam. 2.  Possible constipation. 3. No other explanation for pain. 4. Esophageal air fluid level suggests dysmotility or gastroesophageal reflux.   Electronically Signed   By: Jeronimo Greaves M.D.   On: 01/08/2014 19:34    EKG Interpretation   None      MDM  Patient presents with abdominal pain which has been documented in the left lower quadrant on several prior visits. No definitive etiology has been found. She does seem to have some dysmotility of her he is intact and will be started on Reglan. I have discussed the findings the patient she is in improved condition and will be discharged home with return precautions.     Hilario Quarry, MD 01/08/14 [phone]

## 2014-01-08 NOTE — Discharge Instructions (Signed)
Abdominal Pain, Women °Abdominal (stomach, pelvic, or belly) pain can be caused by many things. It is important to tell your doctor: °· The location of the pain. °· Does it come and go or is it present all the time? °· Are there things that start the pain (eating certain foods, exercise)? °· Are there other symptoms associated with the pain (fever, nausea, vomiting, diarrhea)? °All of this is helpful to know when trying to find the cause of the pain. °CAUSES  °· Stomach: virus or bacteria infection, or ulcer. °· Intestine: appendicitis (inflamed appendix), regional ileitis (Crohn's disease), ulcerative colitis (inflamed colon), irritable bowel syndrome, diverticulitis (inflamed diverticulum of the colon), or cancer of the stomach or intestine. °· Gallbladder disease or stones in the gallbladder. °· Kidney disease, kidney stones, or infection. °· Pancreas infection or cancer. °· Fibromyalgia (pain disorder). °· Diseases of the female organs: °· Uterus: fibroid (non-cancerous) tumors or infection. °· Fallopian tubes: infection or tubal pregnancy. °· Ovary: cysts or tumors. °· Pelvic adhesions (scar tissue). °· Endometriosis (uterus lining tissue growing in the pelvis and on the pelvic organs). °· Pelvic congestion syndrome (female organs filling up with blood just before the menstrual period). °· Pain with the menstrual period. °· Pain with ovulation (producing an egg). °· Pain with an IUD (intrauterine device, birth control) in the uterus. °· Cancer of the female organs. °· Functional pain (pain not caused by a disease, may improve without treatment). °· Psychological pain. °· Depression. °DIAGNOSIS  °Your doctor will decide the seriousness of your pain by doing an examination. °· Blood tests. °· X-rays. °· Ultrasound. °· CT scan (computed tomography, special type of X-Aniqa Hare). °· MRI (magnetic resonance imaging). °· Cultures, for infection. °· Barium enema (dye inserted in the large intestine, to better view it with  X-rays). °· Colonoscopy (looking in intestine with a lighted tube). °· Laparoscopy (minor surgery, looking in abdomen with a lighted tube). °· Major abdominal exploratory surgery (looking in abdomen with a large incision). °TREATMENT  °The treatment will depend on the cause of the pain.  °· Many cases can be observed and treated at home. °· Over-the-counter medicines recommended by your caregiver. °· Prescription medicine. °· Antibiotics, for infection. °· Birth control pills, for painful periods or for ovulation pain. °· Hormone treatment, for endometriosis. °· Nerve blocking injections. °· Physical therapy. °· Antidepressants. °· Counseling with a psychologist or psychiatrist. °· Minor or major surgery. °HOME CARE INSTRUCTIONS  °· Do not take laxatives, unless directed by your caregiver. °· Take over-the-counter pain medicine only if ordered by your caregiver. Do not take aspirin because it can cause an upset stomach or bleeding. °· Try a clear liquid diet (broth or water) as ordered by your caregiver. Slowly move to a bland diet, as tolerated, if the pain is related to the stomach or intestine. °· Have a thermometer and take your temperature several times a day, and record it. °· Bed rest and sleep, if it helps the pain. °· Avoid sexual intercourse, if it causes pain. °· Avoid stressful situations. °· Keep your follow-up appointments and tests, as your caregiver orders. °· If the pain does not go away with medicine or surgery, you may try: °· Acupuncture. °· Relaxation exercises (yoga, meditation). °· Group therapy. °· Counseling. °SEEK MEDICAL CARE IF:  °· You notice certain foods cause stomach pain. °· Your home care treatment is not helping your pain. °· You need stronger pain medicine. °· You want your IUD removed. °· You feel faint or   lightheaded. °· You develop nausea and vomiting. °· You develop a rash. °· You are having side effects or an allergy to your medicine. °SEEK IMMEDIATE MEDICAL CARE IF:  °· Your  pain does not go away or gets worse. °· You have a fever. °· Your pain is felt only in portions of the abdomen. The right side could possibly be appendicitis. The left lower portion of the abdomen could be colitis or diverticulitis. °· You are passing blood in your stools (bright red or black tarry stools, with or without vomiting). °· You have blood in your urine. °· You develop chills, with or without a fever. °· You pass out. °MAKE SURE YOU:  °· Understand these instructions. °· Will watch your condition. °· Will get help right away if you are not doing well or get worse. °Document Released: 10/05/2007 Document Revised: 03/01/2012 Document Reviewed: 10/25/2009 °[employer]® Patient Information ©2014 [employer]. ° °

## 2014-01-08 NOTE — ED Notes (Signed)
Patient states that she has "severe" abd pain, LLQ, headache,lightheaded, blurry vision. Abd pain started a few days ago and other symptoms started today. Patient states that she has had an ovarian torsion in the past and this feels the similar.

## 2014-02-17 IMAGING — CR DG CHEST 2V
2 series · 2 of 2 positions shown · non-contrast
Comparison: March 22, 2013

CLINICAL DATA: Chest pain

CHEST - 2 VIEW

[w chest pa]
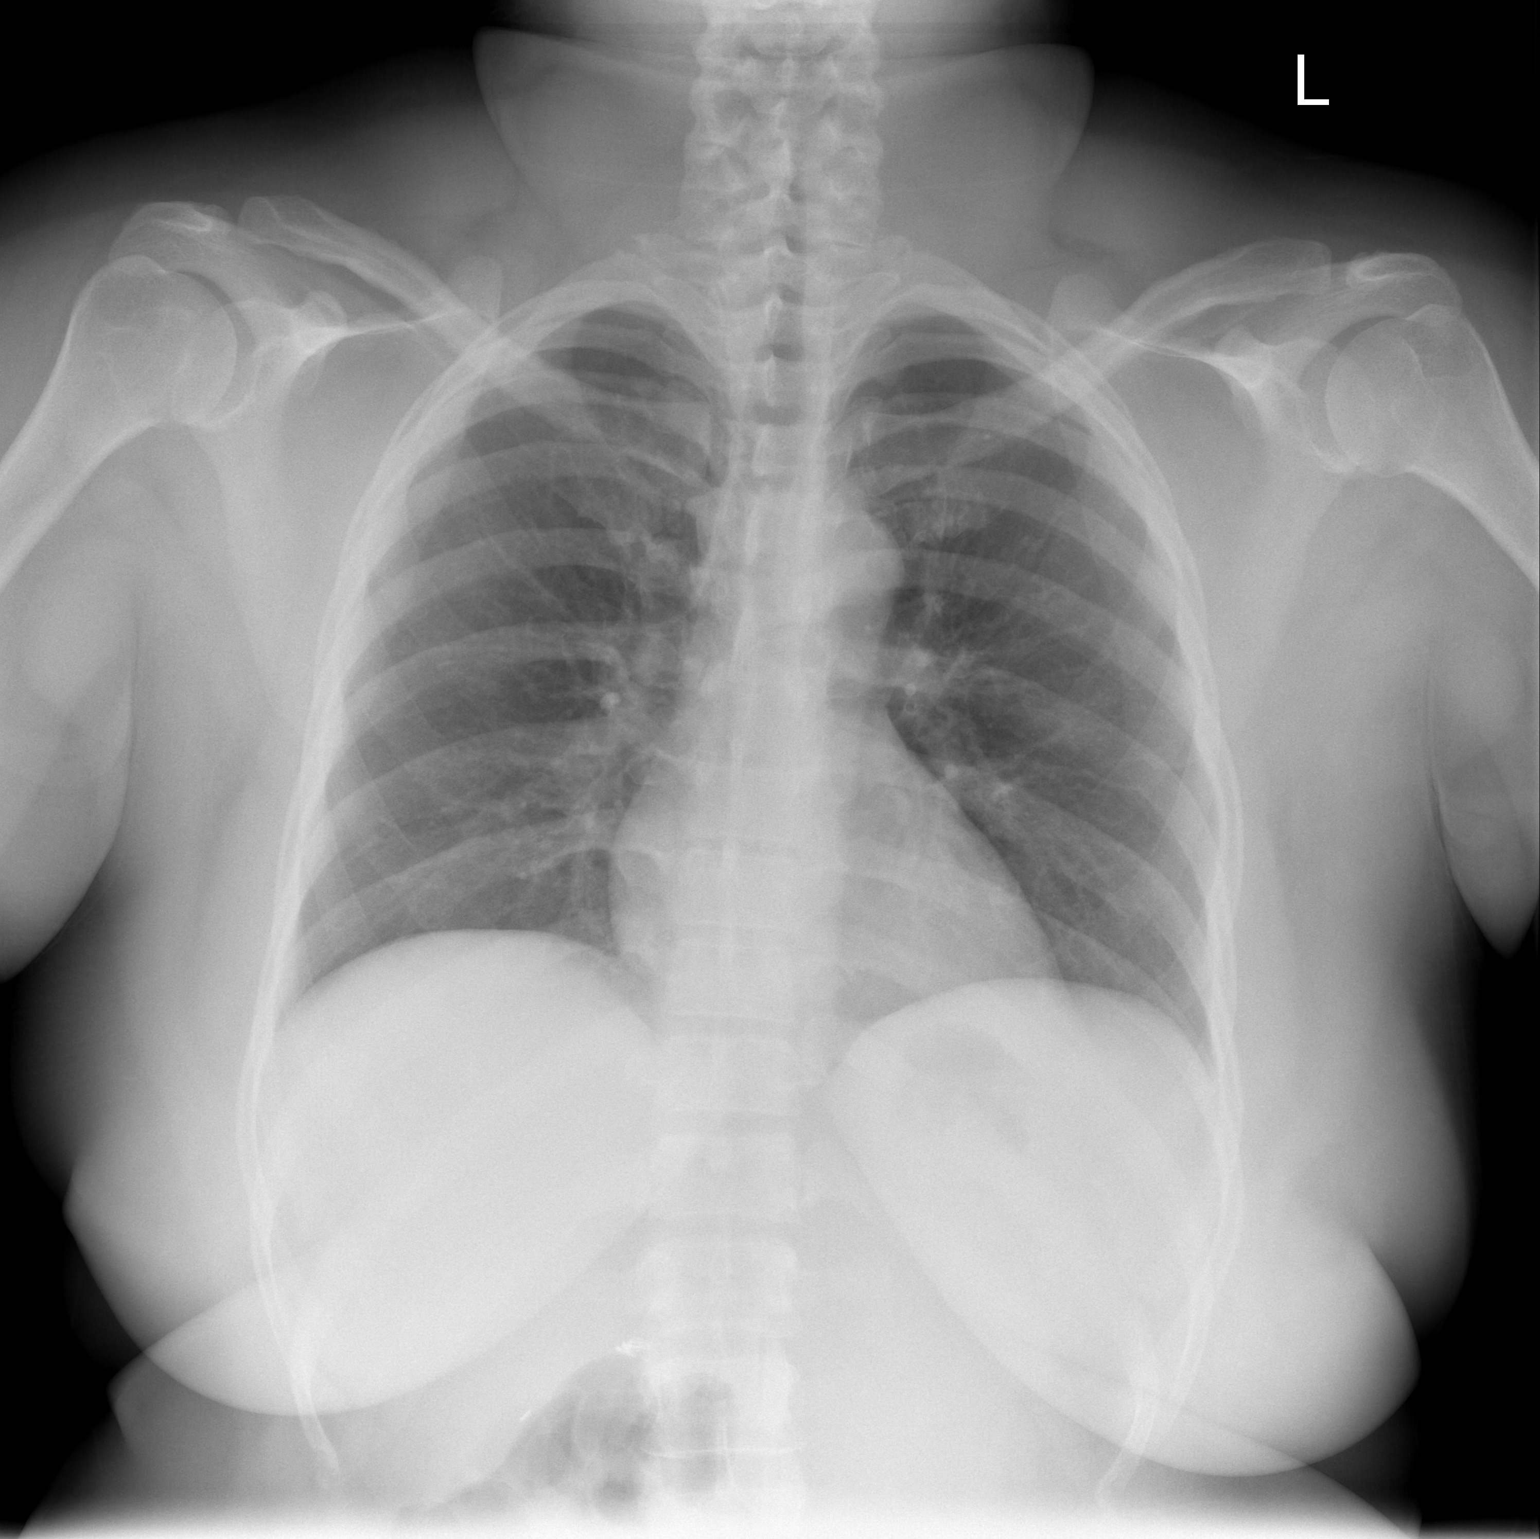

[w chest lat]
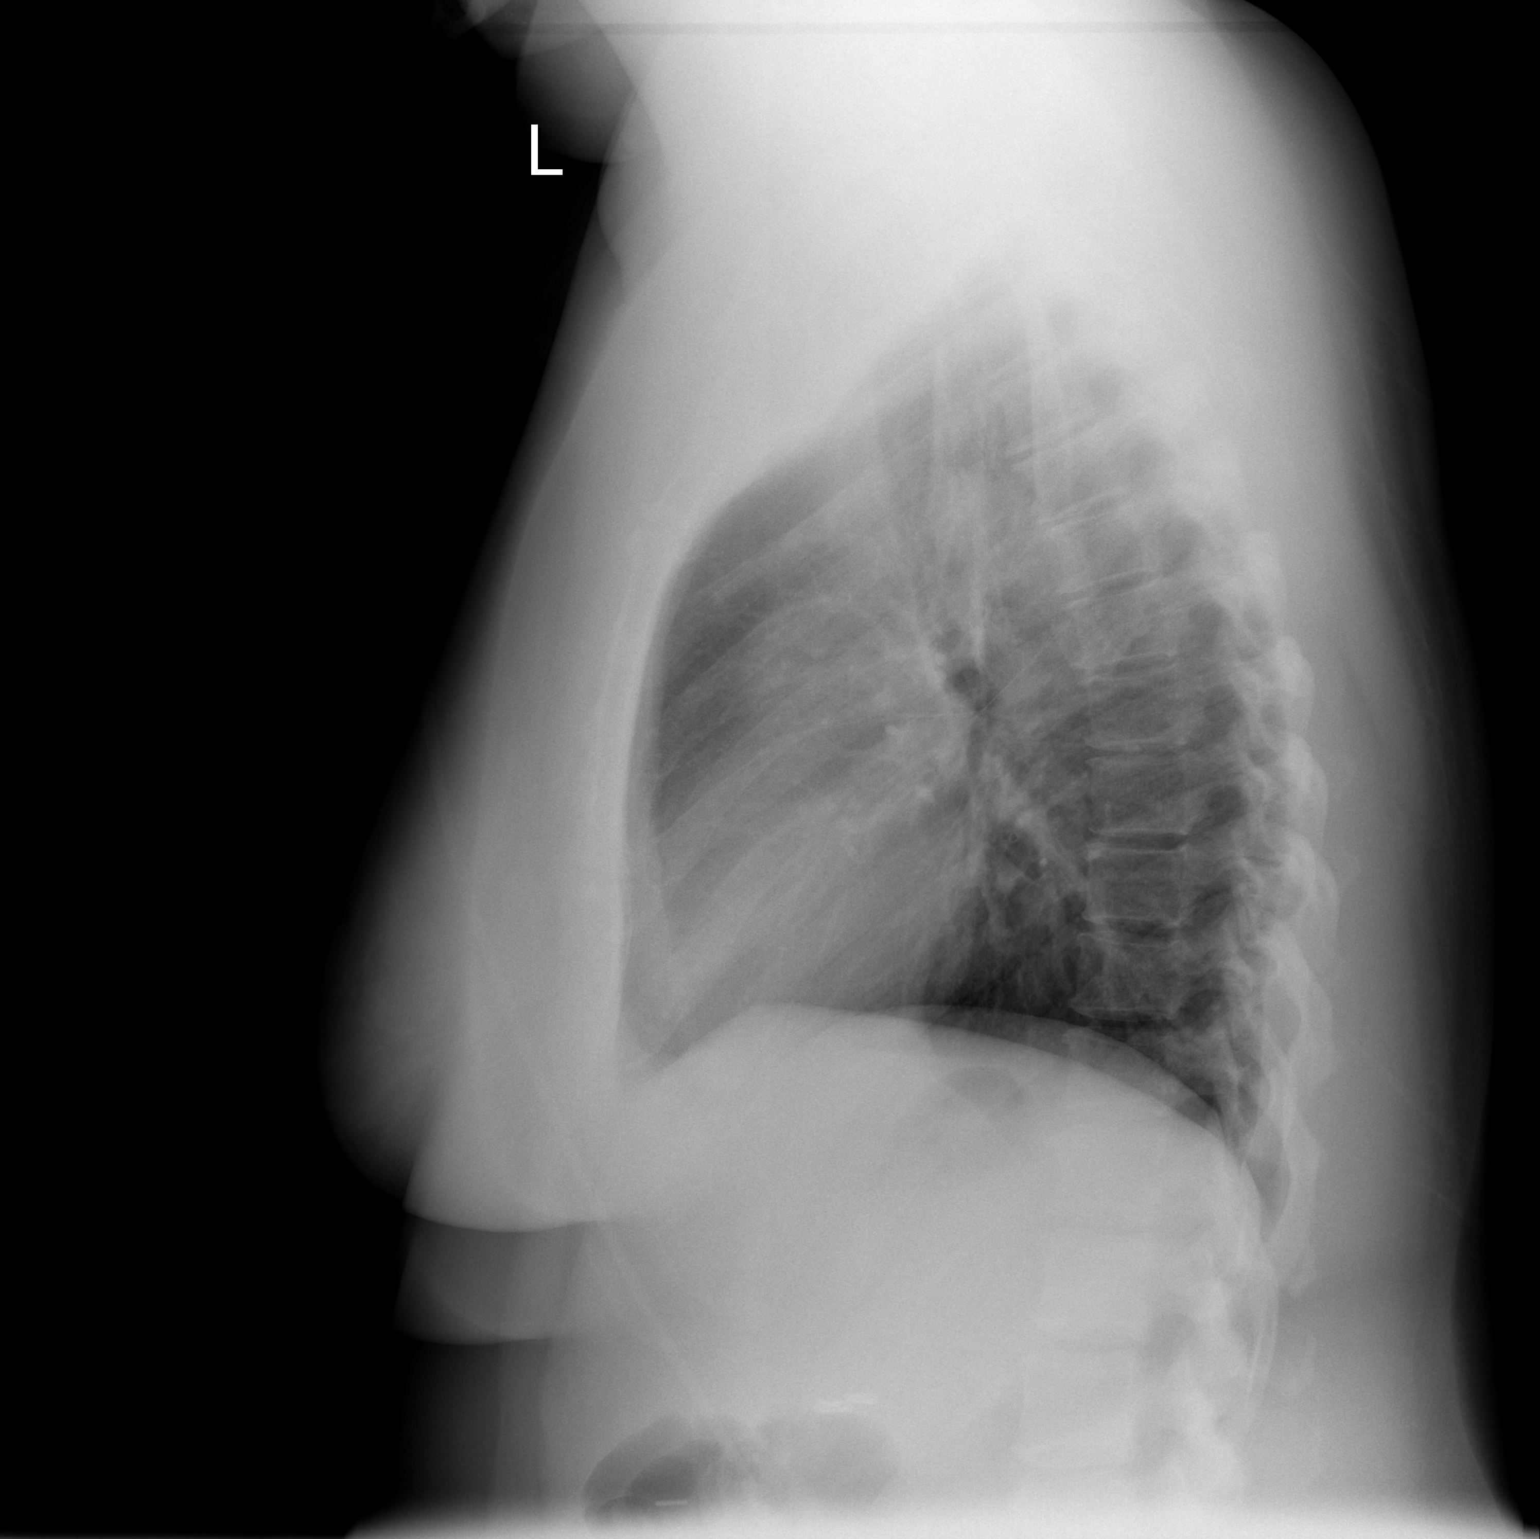

[2 of 2 positions shown; findings below may reference images not displayed]

FINDINGS: Lungs clear.  Heart size and pulmonary vascularity are
normal.  No adenopathy.  No bone lesions.  No pneumothorax.
IMPRESSION: No abnormality noted.

## 2014-10-23 ENCOUNTER — Encounter (HOSPITAL_BASED_OUTPATIENT_CLINIC_OR_DEPARTMENT_OTHER): Payer: Self-pay | Admitting: Emergency Medicine

## 2014-12-22 ENCOUNTER — Encounter (HOSPITAL_BASED_OUTPATIENT_CLINIC_OR_DEPARTMENT_OTHER): Payer: Self-pay | Admitting: *Deleted

## 2014-12-22 ENCOUNTER — Emergency Department (HOSPITAL_BASED_OUTPATIENT_CLINIC_OR_DEPARTMENT_OTHER): Payer: BC Managed Care – PPO

## 2014-12-22 ENCOUNTER — Emergency Department (HOSPITAL_BASED_OUTPATIENT_CLINIC_OR_DEPARTMENT_OTHER)
Admission: EM | Admit: 2014-12-22 | Discharge: 2014-12-22 | Disposition: A | Discharge status: Home / Self Care | Payer: BC Managed Care – PPO | Attending: Emergency Medicine | Admitting: Emergency Medicine

## 2014-12-22 DIAGNOSIS — R1110 Vomiting, unspecified: Secondary | ICD-10-CM | POA: Insufficient documentation

## 2014-12-22 DIAGNOSIS — Z79899 Other long term (current) drug therapy: Secondary | ICD-10-CM | POA: Diagnosis not present

## 2014-12-22 DIAGNOSIS — Z8639 Personal history of other endocrine, nutritional and metabolic disease: Secondary | ICD-10-CM | POA: Diagnosis not present

## 2014-12-22 DIAGNOSIS — Z8742 Personal history of other diseases of the female genital tract: Secondary | ICD-10-CM | POA: Insufficient documentation

## 2014-12-22 DIAGNOSIS — I10 Essential (primary) hypertension: Secondary | ICD-10-CM | POA: Insufficient documentation

## 2014-12-22 DIAGNOSIS — R197 Diarrhea, unspecified: Secondary | ICD-10-CM | POA: Insufficient documentation

## 2014-12-22 DIAGNOSIS — J069 Acute upper respiratory infection, unspecified: Secondary | ICD-10-CM | POA: Diagnosis not present

## 2014-12-22 DIAGNOSIS — K219 Gastro-esophageal reflux disease without esophagitis: Secondary | ICD-10-CM | POA: Diagnosis not present

## 2014-12-22 DIAGNOSIS — R059 Cough, unspecified: Secondary | ICD-10-CM

## 2014-12-22 DIAGNOSIS — Z3202 Encounter for pregnancy test, result negative: Secondary | ICD-10-CM | POA: Insufficient documentation

## 2014-12-22 DIAGNOSIS — R42 Dizziness and giddiness: Secondary | ICD-10-CM | POA: Diagnosis present

## 2014-12-22 DIAGNOSIS — R05 Cough: Secondary | ICD-10-CM

## 2014-12-22 LAB — COMPREHENSIVE METABOLIC PANEL
ALK PHOS: 46 U/L (ref 39–117)
ALT: 19 U/L (ref 0–35)
AST: 19 U/L (ref 0–37)
Albumin: 4.1 g/dL (ref 3.5–5.2)
Anion gap: 6 (ref 5–15)
BUN: 12 mg/dL (ref 6–23)
CO2: 28 mmol/L (ref 19–32)
Calcium: 8.8 mg/dL (ref 8.4–10.5)
Chloride: 103 mEq/L (ref 96–112)
Creatinine, Ser: 0.95 mg/dL (ref 0.50–1.10)
GFR calc Af Amer: 83 mL/min — ABNORMAL LOW (ref >90)
GFR calc non Af Amer: 72 mL/min — ABNORMAL LOW (ref >90)
Glucose, Bld: 93 mg/dL (ref 70–99)
POTASSIUM: 4.2 mmol/L (ref 3.5–5.1)
SODIUM: 137 mmol/L (ref 135–145)
Total Bilirubin: 0.6 mg/dL (ref 0.3–1.2)
Total Protein: 6.8 g/dL (ref 6.0–8.3)

## 2014-12-22 LAB — URINALYSIS, ROUTINE W REFLEX MICROSCOPIC
Bilirubin Urine: NEGATIVE
GLUCOSE, UA: NEGATIVE mg/dL
HGB URINE DIPSTICK: NEGATIVE
Ketones, ur: NEGATIVE mg/dL
LEUKOCYTES UA: NEGATIVE
Nitrite: NEGATIVE
PH: 6 (ref 5.0–8.0)
Protein, ur: NEGATIVE mg/dL
SPECIFIC GRAVITY, URINE: 1.026 (ref 1.005–1.030)
Urobilinogen, UA: 0.2 mg/dL (ref 0.0–1.0)

## 2014-12-22 LAB — PREGNANCY, URINE: PREG TEST UR: NEGATIVE

## 2014-12-22 LAB — CBC WITH DIFFERENTIAL/PLATELET
Basophils Absolute: 0 10*3/uL (ref 0.0–0.1)
Basophils Relative: 1 % (ref 0–1)
Eosinophils Absolute: 0.1 10*3/uL (ref 0.0–0.7)
Eosinophils Relative: 2 % (ref 0–5)
HCT: 39.2 % (ref 36.0–46.0)
HEMOGLOBIN: 12.5 g/dL (ref 12.0–15.0)
LYMPHS PCT: 39 % (ref 12–46)
Lymphs Abs: 3 10*3/uL (ref 0.7–4.0)
MCH: 29.6 pg (ref 26.0–34.0)
MCHC: 31.9 g/dL (ref 30.0–36.0)
MCV: 92.7 fL (ref 78.0–100.0)
MONOS PCT: 6 % (ref 3–12)
Monocytes Absolute: 0.5 10*3/uL (ref 0.1–1.0)
NEUTROS ABS: 4.1 10*3/uL (ref 1.7–7.7)
NEUTROS PCT: 52 % (ref 43–77)
Platelets: 304 10*3/uL (ref 150–400)
RBC: 4.23 MIL/uL (ref 3.87–5.11)
RDW: 13.1 % (ref 11.5–15.5)
WBC: 7.8 10*3/uL (ref 4.0–10.5)

## 2014-12-22 LAB — LIPASE, BLOOD: LIPASE: 19 U/L (ref 11–59)

## 2014-12-22 MED ORDER — SODIUM CHLORIDE 0.9 % IV BOLUS (SEPSIS)
1000.0000 mL | Freq: Once | INTRAVENOUS | Status: AC
  Administered 2014-12-22: 1000 mL via INTRAVENOUS

## 2014-12-22 MED ORDER — PROMETHAZINE HCL 25 MG PO TABS: 25.0000 mg | ORAL_TABLET | Freq: Four times a day (QID) | ORAL | Status: DC | PRN

## 2014-12-22 MED ORDER — PROMETHAZINE HCL 25 MG/ML IJ SOLN
12.5000 mg | Freq: Once | INTRAMUSCULAR | Status: AC
  Administered 2014-12-22: 12.5 mg via INTRAVENOUS
  Filled 2014-12-22: qty 1

## 2014-12-22 NOTE — ED Notes (Addendum)
Pt reports URI x several weeks.  Productive cough.  Pt reports that she has been seen multiple times by her PCP.  A/Ox4.

## 2014-12-22 NOTE — ED Notes (Signed)
Patient states she has a two week history of dizziness and uri and was seen by her pcp and told it was viral.  States Last night she developed nausea, vomiting and diarrhea and became dizzy.

## 2014-12-22 NOTE — Discharge Instructions (Signed)
Nausea and Vomiting °Nausea means you feel sick to your stomach. Throwing up (vomiting) is a reflex where stomach contents come out of your mouth. °HOME CARE  °· Take medicine as told by your doctor. °· Do not force yourself to eat. However, you do need to drink fluids. °· If you feel like eating, eat a normal diet as told by your doctor. °· Eat rice, wheat, potatoes, bread, lean meats, yogurt, fruits, and vegetables. °· Avoid high-fat foods. °· Drink enough fluids to keep your pee (urine) clear or pale yellow. °· Ask your doctor how to replace body fluid losses (rehydrate). Signs of body fluid loss (dehydration) include: °· Feeling very thirsty. °· Dry lips and mouth. °· Feeling dizzy. °· Dark pee. °· Peeing less than normal. °· Feeling confused. °· Fast breathing or heart rate. °GET HELP RIGHT AWAY IF:  °· You have blood in your throw up. °· You have black or bloody poop (stool). °· You have a bad headache or stiff neck. °· You feel confused. °· You have bad belly (abdominal) pain. °· You have chest pain or trouble breathing. °· You do not pee at least once every 8 hours. °· You have cold, clammy skin. °· You keep throwing up after 24 to 48 hours. °· You have a fever. °MAKE SURE YOU:  °· Understand these instructions. °· Will watch your condition. °· Will get help right away if you are not doing well or get worse. °Document Released: 05/26/2008 Document Revised: 03/01/2012 Document Reviewed: 05/09/2011 °ExitCare® Patient Information ©2015 [employer]. This information is not intended to replace advice given to you by your health care provider. Make sure you discuss any questions you have with your health care provider. ° °Upper Respiratory Infection, Adult °An upper respiratory infection (URI) is also sometimes known as the common cold. The upper respiratory tract includes the nose, sinuses, throat, trachea, and bronchi. Bronchi are the airways leading to the lungs. Most people improve within 1 week, but  symptoms can last up to 2 weeks. A residual cough may last even longer.  °CAUSES °Many different viruses can infect the tissues lining the upper respiratory tract. The tissues become irritated and inflamed and often become very moist. Mucus production is also common. A cold is contagious. You can easily spread the virus to others by oral contact. This includes kissing, sharing a glass, coughing, or sneezing. Touching your mouth or nose and then touching a surface, which is then touched by another person, can also spread the virus. °SYMPTOMS  °Symptoms typically develop 1 to 3 days after you come in contact with a cold virus. Symptoms vary from person to person. They may include: °· Runny nose. °· Sneezing. °· Nasal congestion. °· Sinus irritation. °· Sore throat. °· Loss of voice (laryngitis). °· Cough. °· Fatigue. °· Muscle aches. °· Loss of appetite. °· Headache. °· Low-grade fever. °DIAGNOSIS  °You might diagnose your own cold based on familiar symptoms, since most people get a cold 2 to 3 times a year. Your caregiver can confirm this based on your exam. Most importantly, your caregiver can check that your symptoms are not due to another disease such as strep throat, sinusitis, pneumonia, asthma, or epiglottitis. Blood tests, throat tests, and X-rays are not necessary to diagnose a common cold, but they may sometimes be helpful in excluding other more serious diseases. Your caregiver will decide if any further tests are required. °RISKS AND COMPLICATIONS  °You may be at risk for a more severe case   of the common cold if you smoke cigarettes, have chronic heart disease (such as heart failure) or lung disease (such as asthma), or if you have a weakened immune system. The very young and very old are also at risk for more serious infections. Bacterial sinusitis, middle ear infections, and bacterial pneumonia can complicate the common cold. The common cold can worsen asthma and chronic obstructive pulmonary disease  (COPD). Sometimes, these complications can require emergency medical care and may be life-threatening. °PREVENTION  °The best way to protect against getting a cold is to practice good hygiene. Avoid oral or hand contact with people with cold symptoms. Wash your hands often if contact occurs. There is no clear evidence that vitamin C, vitamin E, echinacea, or exercise reduces the chance of developing a cold. However, it is always recommended to get plenty of rest and practice good nutrition. °TREATMENT  °Treatment is directed at relieving symptoms. There is no cure. Antibiotics are not effective, because the infection is caused by a virus, not by bacteria. Treatment may include: °· Increased fluid intake. Sports drinks offer valuable electrolytes, sugars, and fluids. °· Breathing heated mist or steam (vaporizer or shower). °· Eating chicken soup or other clear broths, and maintaining good nutrition. °· Getting plenty of rest. °· Using gargles or lozenges for comfort. °· Controlling fevers with ibuprofen or acetaminophen as directed by your caregiver. °· Increasing usage of your inhaler if you have asthma. °Zinc gel and zinc lozenges, taken in the first 24 hours of the common cold, can shorten the duration and lessen the severity of symptoms. Pain medicines may help with fever, muscle aches, and throat pain. A variety of non-prescription medicines are available to treat congestion and runny nose. Your caregiver can make recommendations and may suggest nasal or lung inhalers for other symptoms.  °HOME CARE INSTRUCTIONS  °· Only take over-the-counter or prescription medicines for pain, discomfort, or fever as directed by your caregiver. °· Use a warm mist humidifier or inhale steam from a shower to increase air moisture. This may keep secretions moist and make it easier to breathe. °· Drink enough water and fluids to keep your urine clear or pale yellow. °· Rest as needed. °· Return to work when your temperature has  returned to normal or as your caregiver advises. You may need to stay home longer to avoid infecting others. You can also use a face mask and careful hand washing to prevent spread of the virus. °SEEK MEDICAL CARE IF:  °· After the first few days, you feel you are getting worse rather than better. °· You need your caregiver's advice about medicines to control symptoms. °· You develop chills, worsening shortness of breath, or brown or red sputum. These may be signs of pneumonia. °· You develop yellow or brown nasal discharge or pain in the face, especially when you bend forward. These may be signs of sinusitis. °· You develop a fever, swollen neck glands, pain with swallowing, or white areas in the back of your throat. These may be signs of strep throat. °SEEK IMMEDIATE MEDICAL CARE IF:  °· You have a fever. °· You develop severe or persistent headache, ear pain, sinus pain, or chest pain. °· You develop wheezing, a prolonged cough, cough up blood, or have a change in your usual mucus (if you have chronic lung disease). °· You develop sore muscles or a stiff neck. °Document Released: 06/03/2001 Document Revised: 03/01/2012 Document Reviewed: 03/15/2014 °ExitCare® Patient Information ©2015 [employer]. This information is not   intended to replace advice given to you by your health care provider. Make sure you discuss any questions you have with your health care provider. ° °

## 2014-12-22 NOTE — ED Provider Notes (Signed)
CSN: [id number]     Arrival date & time 12/22/14  1329 History   First MD Initiated Contact with Patient 12/22/14 1452     Chief Complaint  Patient presents with  . Dizziness  . Emesis     (Consider location/radiation/quality/duration/timing/severity/associated sxs/prior Treatment) HPI Comments: Pt comes in with a c/o or uri times several weeks. She states that she was seen by her pcp and was told that it is viral. Pt states that last night she started with vomiting, diarrhea and abdominal cramping. She states that she started feeling dizzy this morning. Denies fever or dysuria. Hasn't taken any medication for the symptoms  The history is provided by the patient. No language interpreter was used.    Past Medical History  Diagnosis Date  . Hypertension   . GERD (gastroesophageal reflux disease)   . Ovarian cyst   . Thyroid disease    Past Surgical History  Procedure Laterality Date  . Abdominal hysterectomy    . Cholecystectomy    . Breast surgery    . Cesarean section    . Knee surgery    . Esophageal dilation     History reviewed. No pertinent family history. History  Substance Use Topics  . Smoking status: Never Smoker   . Smokeless tobacco: Not on file  . Alcohol Use: No     Comment: wine   OB History    Gravida Para Term Preterm AB TAB SAB Ectopic Multiple Living   2 2             Review of Systems  All other systems reviewed and are negative.     Allergies  Darvocet and Zofran  Home Medications   Prior to Admission medications   Medication Sig Start Date End Date Taking? Authorizing Provider  atenolol (TENORMIN) 50 MG tablet Take 50 mg by mouth daily.      Historical Provider, MD  esomeprazole (NEXIUM) 40 MG capsule Take 40 mg by mouth daily.      Historical Provider, MD   BP 126/82 mmHg  Pulse 86  Temp(Src) 99.4 F (37.4 C) (Oral)  Resp 18  SpO2 100% Physical Exam  Constitutional: She is oriented to person, place, and time. She appears  well-developed and well-nourished.  HENT:  Right Ear: External ear normal.  Left Ear: External ear normal.  Mouth/Throat: Posterior oropharyngeal erythema present.  Eyes: EOM are normal. Pupils are equal, round, and reactive to light.  Neck: Normal range of motion. Neck supple.  Cardiovascular: Normal rate and regular rhythm.   Pulmonary/Chest: Effort normal and breath sounds normal.  Abdominal: Soft. Bowel sounds are normal. There is no tenderness.  Musculoskeletal: Normal range of motion.  Neurological: She is alert and oriented to person, place, and time.  Psychiatric: She has a normal mood and affect.  Nursing note and vitals reviewed.   ED Course  Procedures (including critical care time) Labs Review Labs Reviewed  COMPREHENSIVE METABOLIC PANEL - Abnormal; Notable for the following:    GFR calc non Af Amer 72 (*)    GFR calc Af Amer 83 (*)    All other components within normal limits  LIPASE, BLOOD  CBC WITH DIFFERENTIAL  URINALYSIS, ROUTINE W REFLEX MICROSCOPIC  PREGNANCY, URINE    Imaging Review Dg Chest 2 View  12/22/2014   CLINICAL DATA:  4-5 week history of chest congestion cough and vertigo.  EXAM: CHEST  2 VIEW  COMPARISON:  10/04/2013  FINDINGS: The cardiac silhouette, mediastinal and hilar contours  are within normal limits and stable. The lungs are clear. No pleural effusion. The bony thorax is intact.  IMPRESSION: No acute cardiopulmonary findings.   Electronically Signed   By: Loralie Champagne M.D.   On: 12/22/2014 14:10     EKG Interpretation   Date/Time:  Friday December 22 2014 15:09:46 EST Ventricular Rate:  80 PR Interval:  158 QRS Duration: 78 QT Interval:  376 QTC Calculation: 433 R Axis:   41 Text Interpretation:  Normal sinus rhythm Normal ECG Since previous  tracing nonspecific t wave abnormality is no longer present Confirmed by  Karma Ganja  MD, MARTHA [phone]) on 12/22/2014 3:10:58 PM      MDM   Final diagnoses:  Vomiting and diarrhea  URI  (upper respiratory infection)    Pt is tolerating po here without any problem. No acute infection noted on x-ray. Abdomen is benign on exam. Will send home with phenergan    Teressa Lower, NP 12/22/14 1716  Ethelda Chick, MD 12/23/14 [phone]

## 2014-12-22 NOTE — ED Notes (Signed)
Pt able to tolerate Ginger Ale with no n/v noted.

## 2017-01-12 ENCOUNTER — Emergency Department (HOSPITAL_BASED_OUTPATIENT_CLINIC_OR_DEPARTMENT_OTHER): Payer: BC Managed Care – PPO

## 2017-01-12 ENCOUNTER — Encounter (HOSPITAL_BASED_OUTPATIENT_CLINIC_OR_DEPARTMENT_OTHER): Payer: Self-pay

## 2017-01-12 ENCOUNTER — Emergency Department (HOSPITAL_BASED_OUTPATIENT_CLINIC_OR_DEPARTMENT_OTHER)
Admission: EM | Admit: 2017-01-12 | Discharge: 2017-01-12 | Disposition: A | Discharge status: Home / Self Care | Payer: BC Managed Care – PPO | Attending: Emergency Medicine | Admitting: Emergency Medicine

## 2017-01-12 DIAGNOSIS — R51 Headache: Secondary | ICD-10-CM | POA: Diagnosis not present

## 2017-01-12 DIAGNOSIS — R05 Cough: Secondary | ICD-10-CM | POA: Insufficient documentation

## 2017-01-12 DIAGNOSIS — I10 Essential (primary) hypertension: Secondary | ICD-10-CM | POA: Diagnosis not present

## 2017-01-12 DIAGNOSIS — R197 Diarrhea, unspecified: Secondary | ICD-10-CM

## 2017-01-12 DIAGNOSIS — R059 Cough, unspecified: Secondary | ICD-10-CM

## 2017-01-12 DIAGNOSIS — R112 Nausea with vomiting, unspecified: Secondary | ICD-10-CM | POA: Insufficient documentation

## 2017-01-12 DIAGNOSIS — H9209 Otalgia, unspecified ear: Secondary | ICD-10-CM | POA: Insufficient documentation

## 2017-01-12 DIAGNOSIS — R1011 Right upper quadrant pain: Secondary | ICD-10-CM | POA: Insufficient documentation

## 2017-01-12 DIAGNOSIS — Z79899 Other long term (current) drug therapy: Secondary | ICD-10-CM | POA: Diagnosis not present

## 2017-01-12 LAB — COMPREHENSIVE METABOLIC PANEL
ALT: 20 U/L (ref 14–54)
AST: 20 U/L (ref 15–41)
Albumin: 3.9 g/dL (ref 3.5–5.0)
Alkaline Phosphatase: 53 U/L (ref 38–126)
Anion gap: 7 (ref 5–15)
BUN: 10 mg/dL (ref 6–20)
CO2: 29 mmol/L (ref 22–32)
Calcium: 8.7 mg/dL — ABNORMAL LOW (ref 8.9–10.3)
Chloride: 102 mmol/L (ref 101–111)
Creatinine, Ser: 0.92 mg/dL (ref 0.44–1.00)
GFR calc Af Amer: >60 mL/min (ref >60)
GFR calc non Af Amer: >60 mL/min (ref >60)
Glucose, Bld: 103 mg/dL — ABNORMAL HIGH (ref 65–99)
Potassium: 3.6 mmol/L (ref 3.5–5.1)
Sodium: 138 mmol/L (ref 135–145)
Total Bilirubin: 0.6 mg/dL (ref 0.3–1.2)
Total Protein: 6.6 g/dL (ref 6.5–8.1)

## 2017-01-12 LAB — CBC WITH DIFFERENTIAL/PLATELET
Basophils Absolute: 0 10*3/uL (ref 0.0–0.1)
Basophils Relative: 0 %
Eosinophils Absolute: 0.2 10*3/uL (ref 0.0–0.7)
Eosinophils Relative: 2 %
HCT: 37.3 % (ref 36.0–46.0)
Hemoglobin: 11.9 g/dL — ABNORMAL LOW (ref 12.0–15.0)
Lymphocytes Relative: 37 %
Lymphs Abs: 3.3 10*3/uL (ref 0.7–4.0)
MCH: 29.5 pg (ref 26.0–34.0)
MCHC: 31.9 g/dL (ref 30.0–36.0)
MCV: 92.3 fL (ref 78.0–100.0)
Monocytes Absolute: 0.5 10*3/uL (ref 0.1–1.0)
Monocytes Relative: 6 %
Neutro Abs: 5 10*3/uL (ref 1.7–7.7)
Neutrophils Relative %: 55 %
Platelets: 303 10*3/uL (ref 150–400)
RBC: 4.04 MIL/uL (ref 3.87–5.11)
RDW: 12.5 % (ref 11.5–15.5)
WBC: 9.1 10*3/uL (ref 4.0–10.5)

## 2017-01-12 LAB — URINALYSIS, ROUTINE W REFLEX MICROSCOPIC
Bilirubin Urine: NEGATIVE
GLUCOSE, UA: NEGATIVE mg/dL
Hgb urine dipstick: NEGATIVE
KETONES UR: NEGATIVE mg/dL
Leukocytes, UA: NEGATIVE
Nitrite: NEGATIVE
PH: 5.5 (ref 5.0–8.0)
Protein, ur: NEGATIVE mg/dL
Specific Gravity, Urine: 1.036 — ABNORMAL HIGH (ref 1.005–1.030)

## 2017-01-12 MED ORDER — OSELTAMIVIR PHOSPHATE 75 MG PO CAPS: 75.0000 mg | ORAL_CAPSULE | Freq: Two times a day (BID) | ORAL | 0 refills | Status: DC

## 2017-01-12 MED ORDER — PROMETHAZINE HCL 25 MG PO TABS: 25.0000 mg | ORAL_TABLET | Freq: Four times a day (QID) | ORAL | 0 refills | Status: DC | PRN

## 2017-01-12 MED ORDER — PROMETHAZINE HCL 25 MG PO TABS
25.0000 mg | ORAL_TABLET | Freq: Once | ORAL | Status: AC
  Administered 2017-01-12: 25 mg via ORAL
  Filled 2017-01-12: qty 1

## 2017-01-12 MED ORDER — BENZONATATE 100 MG PO CAPS: 100.0000 mg | ORAL_CAPSULE | Freq: Three times a day (TID) | ORAL | 0 refills | Status: DC

## 2017-01-12 MED ORDER — METOCLOPRAMIDE HCL 10 MG PO TABS
10.0000 mg | ORAL_TABLET | Freq: Once | ORAL | Status: DC
  Filled 2017-01-12: qty 1

## 2017-01-12 NOTE — Discharge Instructions (Signed)
You have flulike symptoms today with stable lab values.  Medications: Tamiflu, Phenergan, Tessalon  Treatment: Take Tamiflu as prescribed for 5 days. Take Phenergan every 6 hours as needed for nausea and vomiting. Take Tessalon every 8 hours as needed for cough. Make sure to drink plenty of water.  Follow-up: Please follow-up with your primary care provider in 2 days for recheck of symptoms. Please return to the emergency department if you develop any new or worsening symptoms.

## 2017-01-12 NOTE — ED Notes (Signed)
ED Provider at bedside. 

## 2017-01-12 NOTE — ED Notes (Signed)
Pt verbalizes understanding of d/c instructions and denies any further needs at this time. 

## 2017-01-12 NOTE — ED Notes (Signed)
Pt sipping on ginger ale

## 2017-01-12 NOTE — ED Provider Notes (Signed)
[facility] Note   CSN: [id number] Arrival date & time: 01/12/17  1553  By signing my name below, I, Linna Darner, attest that this documentation has been prepared under the direction and in the presence of [employer], PA-C. Electronically Signed: Linna Darner, Scribe. 01/12/2017. 6:05 PM.  History   Chief Complaint Chief Complaint  Patient presents with  . Emesis    The history is provided by the patient. No language interpreter was used.     HPI Comments: [name] is a 46 y.o. female who presents to the Emergency Department complaining of persistent nausea and vomiting beginning yesterday. She reports associated diarrhea, body aches, headache, fever, ear pain, and upper abdominal pain. She also reports a productive cough with occasional streaks of blood and blood clots that are about "a tablespoon" in size. Pt reports pain in her RUQ that radiates into her back and is worse with coughing. She notes she has been unable to retain most food and fluid intake. Pt has been having vomiting and diarrhea every few hours since onset. She has tried Delsym and Tylenol with minimal improvement of her symptoms; her last dose of either was last night. Pt reports she had a thyroidectomy on 10/27/16 but notes no surgeries since. No recent immobilizations. No h/o immunocompromised conditions or blood clots. No hormone therapy. She denies hematemesis, hematochezia, dysuria, leg pain/swelling, SOB, or any other associated symptoms.  Past Medical History:  Diagnosis Date  . GERD (gastroesophageal reflux disease)   . Hypertension   . Ovarian cyst   . Thyroid disease     There are no active problems to display for this patient.   Past Surgical History:  Procedure Laterality Date  . ABDOMINAL HYSTERECTOMY    . BREAST SURGERY    . CESAREAN SECTION    . CHOLECYSTECTOMY    . ESOPHAGEAL DILATION    . KNEE SURGERY    . THYROIDECTOMY      OB History    Gravida Para  Term Preterm AB Living   2 2           SAB TAB Ectopic Multiple Live Births                   Home Medications    Prior to Admission medications   Medication Sig Start Date End Date Taking? Authorizing Provider  Levothyroxine Sodium (SYNTHROID PO) Take by mouth.   Yes Historical Provider, MD  LISINOPRIL PO Take by mouth.   Yes Historical Provider, MD  benzonatate (TESSALON) 100 MG capsule Take 1 capsule (100 mg total) by mouth every 8 (eight) hours. 01/12/17   Emi Holes, PA-C  esomeprazole (NEXIUM) 40 MG capsule Take 40 mg by mouth daily.      Historical Provider, MD  oseltamivir (TAMIFLU) 75 MG capsule Take 1 capsule (75 mg total) by mouth every 12 (twelve) hours. 01/12/17   Emi Holes, PA-C  promethazine (PHENERGAN) 25 MG tablet Take 1 tablet (25 mg total) by mouth every 6 (six) hours as needed for nausea or vomiting. 01/12/17   Emi Holes, PA-C    Family History No family history on file.  Social History Social History  Substance Use Topics  . Smoking status: Never Smoker  . Smokeless tobacco: Never Used  . Alcohol use Yes     Comment: occ     Allergies   Darvocet [propoxyphene n-acetaminophen]; Reglan [metoclopramide]; and Zofran   Review of Systems Review of Systems  Constitutional:  Positive for fever.  HENT: Positive for ear pain.   Respiratory: Positive for cough (+hemoptysis). Negative for shortness of breath.   Cardiovascular: Negative for leg swelling.  Gastrointestinal: Positive for abdominal pain, diarrhea, nausea and vomiting. Negative for blood in stool.  Genitourinary: Negative for dysuria.  Musculoskeletal: Positive for myalgias. Negative for back pain.  Skin: Negative for rash and wound.  Neurological: Positive for headaches.  Psychiatric/Behavioral: The patient is not nervous/anxious.      Physical Exam Updated Vital Signs BP 123/74 (BP Location: Left Arm)   Pulse 81   Temp 98.5 F (36.9 C) (Oral)   Resp 16   Ht 5\' 2"  (1.575 m)    Wt 107.5 kg   SpO2 100%   BMI 43.35 kg/m   Physical Exam  Constitutional: She appears well-developed and well-nourished. No distress.  HENT:  Head: Normocephalic and atraumatic.  Right Ear: Tympanic membrane normal.  Left Ear: Tympanic membrane normal.  Mouth/Throat: Oropharynx is clear and moist. No oropharyngeal exudate.  Eyes: Conjunctivae are normal. Pupils are equal, round, and reactive to light. Right eye exhibits no discharge. Left eye exhibits no discharge. No scleral icterus.  Neck: Normal range of motion. Neck supple. No thyromegaly present.  Cardiovascular: Normal rate, regular rhythm, normal heart sounds and intact distal pulses.  Exam reveals no gallop and no friction rub.   No murmur heard. Pulmonary/Chest: Effort normal and breath sounds normal. No stridor. No respiratory distress. She has no wheezes. She has no rales.  Abdominal: Soft. Bowel sounds are normal. She exhibits no distension. There is tenderness (RUQ). There is no rebound and no guarding.  Musculoskeletal: She exhibits no edema.  No bilateral calf TTP  Lymphadenopathy:    She has no cervical adenopathy.  Neurological: She is alert. Coordination normal.  Skin: Skin is warm and dry. No rash noted. She is not diaphoretic. No pallor.  Psychiatric: She has a normal mood and affect.  Nursing note and vitals reviewed.    ED Treatments / Results  Labs (all labs ordered are listed, but only abnormal results are displayed) Labs Reviewed  URINALYSIS, ROUTINE W REFLEX MICROSCOPIC - Abnormal; Notable for the following:       Result Value   Specific Gravity, Urine 1.036 (*)    All other components within normal limits  COMPREHENSIVE METABOLIC PANEL - Abnormal; Notable for the following:    Glucose, Bld 103 (*)    Calcium 8.7 (*)    All other components within normal limits  CBC WITH DIFFERENTIAL/PLATELET - Abnormal; Notable for the following:    Hemoglobin 11.9 (*)    All other components within normal  limits    EKG  EKG Interpretation None       Radiology Dg Chest 2 View  Result Date: 01/12/2017 CLINICAL DATA:  Productive cough, nausea, vomiting and diarrhea. Right breast pain. EXAM: CHEST  2 VIEW COMPARISON:  01/29/2016 CXR FINDINGS: The heart size and mediastinal contours are within normal limits. Both lungs are clear. The visualized skeletal structures are unremarkable. IMPRESSION: No active cardiopulmonary disease. Electronically Signed   By: Tollie Ethavid  Kwon M.D.   On: 01/12/2017 19:05    Procedures Procedures (including critical care time)  DIAGNOSTIC STUDIES: Oxygen Saturation is 100% on RA, normal by my interpretation.    COORDINATION OF CARE: 6:13 PM Discussed treatment plan with pt at bedside and pt agreed to plan.  Medications Ordered in ED Medications  promethazine (PHENERGAN) tablet 25 mg (25 mg Oral Given 01/12/17 1930)  Initial Impression / Assessment and Plan / ED Course  I have reviewed the triage vital signs and the nursing notes.  Pertinent labs & imaging results that were available during my care of the patient were reviewed by me and considered in my medical decision making (see chart for details).     Patient with symptoms consistent with influenza.  CBC shows hemoglobin 11.9. CMP shows glucose 103, calcium 8.7. UA negative. CXR shows no active cardiopulmonary disease. Vitals are stable, low-grade fever.  No signs of dehydration, tolerating PO's.  Lungs are clear. Patient is s/p cholecystectomy and has more likely flulike symptoms versus any other cause of right upper quadrant pain.  Discussed the cost versus benefit of Tamiflu treatment with the patient and patient would like prescription. Patient will be discharged with instructions to orally hydrate, rest, and use over-the-counter medications such as anti-inflammatories ibuprofen and Aleve for muscle aches and Tylenol for fever.  Patient will also be given a cough suppressant and Phenergan for nausea and  vomiting. Patient given strict return precautions for worsening and localizing abdominal pain or any other new symptoms. Patient to follow-up with PCP as needed. Patient understands and agrees with plan. Patient vitals stable throughout ED course and discharged in satisfactory condition. I discussed patient case with Dr. Anitra Lauth who guided the patient's management and agrees with plan.   Final Clinical Impressions(s) / ED Diagnoses   Final diagnoses:  Nausea vomiting and diarrhea  Cough    New Prescriptions Discharge Medication List as of 01/12/2017  8:17 PM    START taking these medications   Details  benzonatate (TESSALON) 100 MG capsule Take 1 capsule (100 mg total) by mouth every 8 (eight) hours., Starting Mon 01/12/2017, Print    oseltamivir (TAMIFLU) 75 MG capsule Take 1 capsule (75 mg total) by mouth every 12 (twelve) hours., Starting Mon 01/12/2017, Print    promethazine (PHENERGAN) 25 MG tablet Take 1 tablet (25 mg total) by mouth every 6 (six) hours as needed for nausea or vomiting., Starting Mon 01/12/2017, Print       I personally performed the services described in this documentation, which was scribed in my presence. The recorded information has been reviewed and is accurate.    Emi Holes, PA-C 01/13/17 0140    Gwyneth Sprout, MD 01/15/17 2206

## 2017-01-12 NOTE — ED Triage Notes (Signed)
C/o n/v/d, pain to right breast, prod cough-started last night-NAD-steady gait

## 2017-01-12 NOTE — ED Notes (Signed)
Pt tolerating PO fluids

## 2017-11-07 ENCOUNTER — Encounter (HOSPITAL_BASED_OUTPATIENT_CLINIC_OR_DEPARTMENT_OTHER): Payer: Self-pay | Admitting: *Deleted

## 2017-11-07 ENCOUNTER — Other Ambulatory Visit: Payer: Self-pay

## 2017-11-07 ENCOUNTER — Emergency Department (HOSPITAL_BASED_OUTPATIENT_CLINIC_OR_DEPARTMENT_OTHER)
Admission: EM | Admit: 2017-11-07 | Discharge: 2017-11-08 | Disposition: A | Discharge status: Home / Self Care | Payer: BC Managed Care – PPO | Attending: Emergency Medicine | Admitting: Emergency Medicine

## 2017-11-07 DIAGNOSIS — M79674 Pain in right toe(s): Secondary | ICD-10-CM | POA: Diagnosis present

## 2017-11-07 DIAGNOSIS — E119 Type 2 diabetes mellitus without complications: Secondary | ICD-10-CM | POA: Insufficient documentation

## 2017-11-07 DIAGNOSIS — Z79899 Other long term (current) drug therapy: Secondary | ICD-10-CM | POA: Insufficient documentation

## 2017-11-07 DIAGNOSIS — L600 Ingrowing nail: Secondary | ICD-10-CM | POA: Insufficient documentation

## 2017-11-07 DIAGNOSIS — I10 Essential (primary) hypertension: Secondary | ICD-10-CM | POA: Diagnosis not present

## 2017-11-07 HISTORY — DX: Type 2 diabetes mellitus without complications: E11.9

## 2017-11-07 MED ORDER — BUPIVACAINE HCL (PF) 0.5 % IJ SOLN
20.0000 mL | Freq: Once | INTRAMUSCULAR | Status: AC
Start: 2017-11-07 — End: 2017-11-08
  Administered 2017-11-08: 20 mL

## 2017-11-07 NOTE — ED Triage Notes (Signed)
Patient states she has a one week history of ingrown pain.  States now the toe is infected and has an odor to it.  History of DM.

## 2017-11-07 NOTE — ED Provider Notes (Signed)
[facility] EMERGENCY DEPARTMENT Provider Note   CSN: [id number] Arrival date & time: 11/07/17  1823     History   Chief Complaint Chief Complaint  Patient presents with  . Toe Pain    Right great toe    HPI [name] is a 46 y.o. female.  Patient is a 46 year old female with a history of diabetes and hypertension who presents with pain and swelling to the right big toe.  She feels like she has an ingrown nail.  She is recently had some drainage from the area.  There is no foot swelling.  No fevers.  Her symptoms have been worsening over the last week.  She states that she has been soaking it in Epsom salts without improvement in symptoms.      Past Medical History:  Diagnosis Date  . Diabetes mellitus without complication (HCC)   . GERD (gastroesophageal reflux disease)   . Hypertension   . Ovarian cyst   . Thyroid disease     There are no active problems to display for this patient.   Past Surgical History:  Procedure Laterality Date  . ABDOMINAL HYSTERECTOMY    . BREAST SURGERY    . CESAREAN SECTION    . CHOLECYSTECTOMY    . ESOPHAGEAL DILATION    . KNEE SURGERY    . THYROIDECTOMY      OB History    Gravida Para Term Preterm AB Living   2 2           SAB TAB Ectopic Multiple Live Births                   Home Medications    Prior to Admission medications   Medication Sig Start Date End Date Taking? Authorizing Provider  Levothyroxine Sodium (SYNTHROID PO) Take by mouth.   Yes [provider]  omeprazole (PRILOSEC) 20 MG capsule Take 20 mg daily by mouth.   Yes [provider]  promethazine (PHENERGAN) 25 MG tablet Take 1 tablet (25 mg total) by mouth every 6 (six) hours as needed for nausea or vomiting. 01/12/17  Yes Law, Alexandra M, PA-C  benzonatate (TESSALON) 100 MG capsule Take 1 capsule (100 mg total) by mouth every 8 (eight) hours. 01/12/17   Law, Waylan BogaAlexandra M, PA-C  cephALEXin (KEFLEX) 500 MG capsule Take 1  capsule (500 mg total) 3 (three) times daily by mouth. 11/08/17   Rolan BuccoBelfi, Charleston Hankin, MD  esomeprazole (NEXIUM) 40 MG capsule Take 40 mg by mouth daily.      [provider]  LISINOPRIL PO Take by mouth.    [provider]  oseltamivir (TAMIFLU) 75 MG capsule Take 1 capsule (75 mg total) by mouth every 12 (twelve) hours. 01/12/17   Law, Waylan BogaAlexandra M, PA-C  traMADol (ULTRAM) 50 MG tablet Take 1 tablet (50 mg total) every 6 (six) hours as needed by mouth. 11/08/17   Rolan BuccoBelfi, Zavier Canela, MD    Family History No family history on file.  Social History Social History   Tobacco Use  . Smoking status: Never Smoker  . Smokeless tobacco: Never Used  Substance Use Topics  . Alcohol use: Yes    Comment: occ  . Drug use: No     Allergies   Darvocet [propoxyphene n-acetaminophen]; Reglan [metoclopramide]; and Zofran   Review of Systems Review of Systems  Constitutional: Negative for fever.  Gastrointestinal: Negative for nausea and vomiting.  Musculoskeletal: Negative for arthralgias, back pain, joint swelling and neck pain.  Skin: Positive for wound.  Neurological: Negative for weakness, numbness and headaches.     Physical Exam Updated Vital Signs BP (!) 149/74 (BP Location: Right Arm)   Pulse 88   Temp 98.1 F (36.7 C) (Oral)   Resp 16   Ht 5\' 2"  (1.575 m)   Wt 109.3 kg (241 lb)   SpO2 100%   BMI 44.08 kg/m   Physical Exam  Constitutional: She is oriented to person, place, and time. She appears well-developed and well-nourished.  HENT:  Head: Normocephalic and atraumatic.  Neck: Normal range of motion. Neck supple.  Cardiovascular: Normal rate.  Pulmonary/Chest: Effort normal.  Musculoskeletal: She exhibits edema and tenderness.  Patient has an ingrown toenail involving the lateral aspect of the right big toe.  There is small amount of drainage.  There is minimal surrounding erythema.  There is no swelling of the remainder of the toe or foot.  She is  neurovascularly intact.  Neurological: She is alert and oriented to person, place, and time.  Skin: Skin is warm and dry.  Psychiatric: She has a normal mood and affect.     ED Treatments / Results  Labs (all labs ordered are listed, but only abnormal results are displayed) Labs Reviewed - No data to display  EKG  EKG Interpretation None       Radiology No results found.  Procedures .Nail Removal Date/Time: 11/08/2017 1:55 AM Performed by: Rolan Bucco, MD Authorized by: Rolan Bucco, MD   Consent:    Consent obtained:  Verbal   Consent given by:  Patient   Risks discussed:  Permanent nail deformity, infection, incomplete removal and bleeding   Alternatives discussed:  No treatment Location:    Foot:  R big toe Pre-procedure details:    Skin preparation:  Betadine Anesthesia (see MAR for exact dosages):    Anesthesia method:  Nerve block   Block location:  Digital   Block needle gauge:  25 G   Block anesthetic:  Bupivacaine 0.25% w/o epi   Block injection procedure:  Anatomic landmarks identified, introduced needle, incremental injection and negative aspiration for blood   Block outcome:  Anesthesia achieved Ingrown nail:    Nail matrix removed or ablated:  Partial Post-procedure details:    Dressing:  Non-adhesive packing strip   Patient tolerance of procedure:  Tolerated well, no immediate complications Comments:     Lateral 1/4 of nail with matrix removed   (including critical care time)  Medications Ordered in ED Medications  bupivacaine (MARCAINE) 0.5 % injection 20 mL (not administered)  cephALEXin (KEFLEX) capsule 500 mg (not administered)  HYDROmorphone (DILAUDID) injection 1 mg (1 mg Intramuscular Given 11/08/17 0117)     Initial Impression / Assessment and Plan / ED Course  I have reviewed the triage vital signs and the nursing notes.  Pertinent labs & imaging results that were available during my care of the patient were reviewed by me  and considered in my medical decision making (see chart for details).     Patient presents with ingrown toenail.  The lateral part of the nail was removed.  Patient was encouraged to have a recheck of the area within the next 2-3 days by her PCP.  Her tetanus shot is up-to-date.  She was started on Keflex.  She was given wound care instructions.  Return precautions were given.  Final Clinical Impressions(s) / ED Diagnoses   Final diagnoses:  Ingrown toenail of right foot    ED Discharge Orders  Ordered    cephALEXin (KEFLEX) 500 MG capsule  3 times daily     11/08/17 0151    traMADol (ULTRAM) 50 MG tablet  Every 6 hours PRN     11/08/17 0152       Rolan BuccoBelfi, Shavawn Stobaugh, MD 11/08/17 0157

## 2017-11-08 MED ORDER — CEPHALEXIN 500 MG PO CAPS: 500.0000 mg | ORAL_CAPSULE | Freq: Three times a day (TID) | ORAL | 0 refills | Status: DC

## 2017-11-08 MED ORDER — CEPHALEXIN 250 MG PO CAPS
500.0000 mg | ORAL_CAPSULE | Freq: Once | ORAL | Status: AC
  Administered 2017-11-08: 500 mg via ORAL
  Filled 2017-11-08: qty 2

## 2017-11-08 MED ORDER — TRAMADOL HCL 50 MG PO TABS: 50.0000 mg | ORAL_TABLET | Freq: Four times a day (QID) | ORAL | 0 refills | Status: DC | PRN

## 2017-11-08 MED ORDER — HYDROMORPHONE HCL 1 MG/ML IJ SOLN
1.0000 mg | Freq: Once | INTRAMUSCULAR | Status: AC
Start: 2017-11-08 — End: 2017-11-08
  Administered 2017-11-08: 1 mg via INTRAMUSCULAR
  Filled 2017-11-08: qty 1

## 2018-03-12 ENCOUNTER — Emergency Department (HOSPITAL_BASED_OUTPATIENT_CLINIC_OR_DEPARTMENT_OTHER): Payer: Worker's Compensation

## 2018-03-12 ENCOUNTER — Emergency Department (HOSPITAL_BASED_OUTPATIENT_CLINIC_OR_DEPARTMENT_OTHER)
Admission: EM | Admit: 2018-03-12 | Discharge: 2018-03-12 | Disposition: A | Discharge status: Home / Self Care | Payer: Worker's Compensation | Attending: Emergency Medicine | Admitting: Emergency Medicine

## 2018-03-12 ENCOUNTER — Other Ambulatory Visit: Payer: Self-pay

## 2018-03-12 ENCOUNTER — Encounter (HOSPITAL_BASED_OUTPATIENT_CLINIC_OR_DEPARTMENT_OTHER): Payer: Self-pay | Admitting: *Deleted

## 2018-03-12 DIAGNOSIS — Y998 Other external cause status: Secondary | ICD-10-CM | POA: Insufficient documentation

## 2018-03-12 DIAGNOSIS — I10 Essential (primary) hypertension: Secondary | ICD-10-CM | POA: Diagnosis not present

## 2018-03-12 DIAGNOSIS — S0990XA Unspecified injury of head, initial encounter: Secondary | ICD-10-CM | POA: Insufficient documentation

## 2018-03-12 DIAGNOSIS — Y9389 Activity, other specified: Secondary | ICD-10-CM | POA: Diagnosis not present

## 2018-03-12 DIAGNOSIS — E119 Type 2 diabetes mellitus without complications: Secondary | ICD-10-CM | POA: Insufficient documentation

## 2018-03-12 DIAGNOSIS — Y92219 Unspecified school as the place of occurrence of the external cause: Secondary | ICD-10-CM | POA: Insufficient documentation

## 2018-03-12 DIAGNOSIS — W01198A Fall on same level from slipping, tripping and stumbling with subsequent striking against other object, initial encounter: Secondary | ICD-10-CM | POA: Diagnosis not present

## 2018-03-12 DIAGNOSIS — S46911A Strain of unspecified muscle, fascia and tendon at shoulder and upper arm level, right arm, initial encounter: Secondary | ICD-10-CM | POA: Diagnosis not present

## 2018-03-12 DIAGNOSIS — S39012A Strain of muscle, fascia and tendon of lower back, initial encounter: Secondary | ICD-10-CM | POA: Diagnosis not present

## 2018-03-12 DIAGNOSIS — Z79899 Other long term (current) drug therapy: Secondary | ICD-10-CM | POA: Insufficient documentation

## 2018-03-12 DIAGNOSIS — S63501A Unspecified sprain of right wrist, initial encounter: Secondary | ICD-10-CM | POA: Diagnosis not present

## 2018-03-12 DIAGNOSIS — S3992XA Unspecified injury of lower back, initial encounter: Secondary | ICD-10-CM | POA: Diagnosis present

## 2018-03-12 MED ORDER — IBUPROFEN 600 MG PO TABS: 600.0000 mg | ORAL_TABLET | Freq: Four times a day (QID) | ORAL | 0 refills | Status: AC | PRN

## 2018-03-12 MED ORDER — MORPHINE SULFATE (PF) 4 MG/ML IV SOLN
4.0000 mg | Freq: Once | INTRAVENOUS | Status: AC
  Administered 2018-03-12: 4 mg via INTRAMUSCULAR
  Filled 2018-03-12: qty 1

## 2018-03-12 MED ORDER — DIAZEPAM 5 MG PO TABS: 5.0000 mg | ORAL_TABLET | Freq: Two times a day (BID) | ORAL | 0 refills | Status: DC

## 2018-03-12 MED ORDER — HYDROCODONE-ACETAMINOPHEN 5-325 MG PO TABS: 1.0000 | ORAL_TABLET | ORAL | 0 refills | Status: DC | PRN

## 2018-03-12 MED FILL — IBUPROFEN 600 MG TABLET: 600 | 7 days supply | Qty: 30 | Fill #0

## 2018-03-12 MED FILL — HYDROCODON-APAP 5-325: 5-325 | 2 days supply | Qty: 10 | Fill #0

## 2018-03-12 MED FILL — diazePAM 5 MG TABS: 5 | 5 days supply | Qty: 10 | Fill #0

## 2018-03-12 NOTE — ED Notes (Signed)
During triage process I asked pt if her place of employment required a UDS. She answered yes. Upon further interview it was discovered pt is employed by [employer]. I informed her that normally [employer] does not require a UDS and asked if they sent paper work for a UDS. She answered no she just assumed she needed one. Pt was advised to contact her supervisor and verify if a UDS was required. Pt states she does not feel comfortable asking her supervisor such question.
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
## 2018-03-18 ENCOUNTER — Ambulatory Visit: Payer: BC Managed Care – PPO | Admitting: Family Medicine

## 2018-03-25 ENCOUNTER — Emergency Department (HOSPITAL_BASED_OUTPATIENT_CLINIC_OR_DEPARTMENT_OTHER)
Admission: EM | Admit: 2018-03-25 | Discharge: 2018-03-25 | Disposition: A | Discharge status: Home / Self Care | Payer: BC Managed Care – PPO | Attending: Emergency Medicine | Admitting: Emergency Medicine

## 2018-03-25 ENCOUNTER — Encounter (HOSPITAL_BASED_OUTPATIENT_CLINIC_OR_DEPARTMENT_OTHER): Payer: Self-pay | Admitting: *Deleted

## 2018-03-25 ENCOUNTER — Other Ambulatory Visit: Payer: Self-pay

## 2018-03-25 DIAGNOSIS — R197 Diarrhea, unspecified: Secondary | ICD-10-CM | POA: Insufficient documentation

## 2018-03-25 DIAGNOSIS — R112 Nausea with vomiting, unspecified: Secondary | ICD-10-CM

## 2018-03-25 LAB — COMPREHENSIVE METABOLIC PANEL
ALBUMIN: 4.3 g/dL (ref 3.5–5.0)
ALT: 22 U/L (ref 14–54)
AST: 23 U/L (ref 15–41)
Alkaline Phosphatase: 44 U/L (ref 38–126)
Anion gap: 7 (ref 5–15)
BILIRUBIN TOTAL: 0.4 mg/dL (ref 0.3–1.2)
BUN: 11 mg/dL (ref 6–20)
CO2: 27 mmol/L (ref 22–32)
CREATININE: 1.08 mg/dL — AB (ref 0.44–1.00)
Calcium: 8.8 mg/dL — ABNORMAL LOW (ref 8.9–10.3)
Chloride: 103 mmol/L (ref 101–111)
GFR calc Af Amer: >60 mL/min (ref >60)
GFR calc non Af Amer: >60 mL/min (ref >60)
GLUCOSE: 103 mg/dL — AB (ref 65–99)
POTASSIUM: 3.8 mmol/L (ref 3.5–5.1)
Sodium: 137 mmol/L (ref 135–145)
Total Protein: 7.2 g/dL (ref 6.5–8.1)

## 2018-03-25 LAB — CBC WITH DIFFERENTIAL/PLATELET
BASOS PCT: 0 %
Basophils Absolute: 0 10*3/uL (ref 0.0–0.1)
Eosinophils Absolute: 0.1 10*3/uL (ref 0.0–0.7)
Eosinophils Relative: 1 %
HEMATOCRIT: 37.9 % (ref 36.0–46.0)
Hemoglobin: 12.1 g/dL (ref 12.0–15.0)
LYMPHS PCT: 47 %
Lymphs Abs: 3.9 10*3/uL (ref 0.7–4.0)
MCH: 30 pg (ref 26.0–34.0)
MCHC: 31.9 g/dL (ref 30.0–36.0)
MCV: 93.8 fL (ref 78.0–100.0)
MONO ABS: 0.5 10*3/uL (ref 0.1–1.0)
MONOS PCT: 7 %
NEUTROS ABS: 3.7 10*3/uL (ref 1.7–7.7)
Neutrophils Relative %: 45 %
Platelets: 321 10*3/uL (ref 150–400)
RBC: 4.04 MIL/uL (ref 3.87–5.11)
RDW: 12.4 % (ref 11.5–15.5)
WBC: 8.2 10*3/uL (ref 4.0–10.5)

## 2018-03-25 LAB — URINALYSIS, ROUTINE W REFLEX MICROSCOPIC
Bilirubin Urine: NEGATIVE
Glucose, UA: NEGATIVE mg/dL
Hgb urine dipstick: NEGATIVE
KETONES UR: NEGATIVE mg/dL
LEUKOCYTES UA: NEGATIVE
Nitrite: NEGATIVE
PH: 6.5 (ref 5.0–8.0)
Protein, ur: NEGATIVE mg/dL
SPECIFIC GRAVITY, URINE: 1.025 (ref 1.005–1.030)

## 2018-03-25 LAB — LIPASE, BLOOD: Lipase: 20 U/L (ref 11–51)

## 2018-03-25 MED ORDER — SODIUM CHLORIDE 0.9 % IV BOLUS
1000.0000 mL | Freq: Once | INTRAVENOUS | Status: AC
  Administered 2018-03-25: 1000 mL via INTRAVENOUS

## 2018-03-25 MED ORDER — METHOCARBAMOL 500 MG PO TABS: 500.0000 mg | ORAL_TABLET | Freq: Three times a day (TID) | ORAL | 0 refills | Status: DC | PRN

## 2018-03-25 MED ORDER — PROMETHAZINE HCL 25 MG/ML IJ SOLN
12.5000 mg | Freq: Once | INTRAMUSCULAR | Status: AC
  Administered 2018-03-25: 12.5 mg via INTRAVENOUS
  Filled 2018-03-25: qty 1

## 2018-03-25 MED ORDER — NAPROXEN 500 MG PO TABS: 500.0000 mg | ORAL_TABLET | Freq: Two times a day (BID) | ORAL | 0 refills | Status: AC

## 2018-03-25 MED ORDER — PROMETHAZINE HCL 12.5 MG PO TABS: 12.5000 mg | ORAL_TABLET | Freq: Three times a day (TID) | ORAL | 0 refills | Status: DC | PRN

## 2018-03-25 MED ORDER — MORPHINE SULFATE (PF) 4 MG/ML IV SOLN
4.0000 mg | Freq: Once | INTRAVENOUS | Status: AC
  Administered 2018-03-25: 4 mg via INTRAVENOUS
  Filled 2018-03-25: qty 1
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
## 2018-12-05 ENCOUNTER — Emergency Department (HOSPITAL_BASED_OUTPATIENT_CLINIC_OR_DEPARTMENT_OTHER): Payer: BC Managed Care – PPO

## 2018-12-05 ENCOUNTER — Other Ambulatory Visit: Payer: Self-pay

## 2018-12-05 ENCOUNTER — Encounter (HOSPITAL_BASED_OUTPATIENT_CLINIC_OR_DEPARTMENT_OTHER): Payer: Self-pay | Admitting: *Deleted

## 2018-12-05 ENCOUNTER — Emergency Department (HOSPITAL_BASED_OUTPATIENT_CLINIC_OR_DEPARTMENT_OTHER)
Admission: EM | Admit: 2018-12-05 | Discharge: 2018-12-05 | Disposition: A | Discharge status: Home / Self Care | Payer: BC Managed Care – PPO | Attending: Emergency Medicine | Admitting: Emergency Medicine

## 2018-12-05 DIAGNOSIS — I10 Essential (primary) hypertension: Secondary | ICD-10-CM | POA: Diagnosis not present

## 2018-12-05 DIAGNOSIS — E119 Type 2 diabetes mellitus without complications: Secondary | ICD-10-CM | POA: Diagnosis not present

## 2018-12-05 DIAGNOSIS — Z79899 Other long term (current) drug therapy: Secondary | ICD-10-CM | POA: Diagnosis not present

## 2018-12-05 DIAGNOSIS — R1012 Left upper quadrant pain: Secondary | ICD-10-CM | POA: Insufficient documentation

## 2018-12-05 DIAGNOSIS — K644 Residual hemorrhoidal skin tags: Secondary | ICD-10-CM | POA: Diagnosis not present

## 2018-12-05 DIAGNOSIS — K625 Hemorrhage of anus and rectum: Secondary | ICD-10-CM

## 2018-12-05 DIAGNOSIS — K649 Unspecified hemorrhoids: Secondary | ICD-10-CM

## 2018-12-05 LAB — CBC WITH DIFFERENTIAL/PLATELET
ABS IMMATURE GRANULOCYTES: 0.02 10*3/uL (ref 0.00–0.07)
BASOS ABS: 0 10*3/uL (ref 0.0–0.1)
Basophils Relative: 0 %
Eosinophils Absolute: 0.1 10*3/uL (ref 0.0–0.5)
Eosinophils Relative: 1 %
HCT: 38.8 % (ref 36.0–46.0)
Hemoglobin: 11.8 g/dL — ABNORMAL LOW (ref 12.0–15.0)
Immature Granulocytes: 0 %
LYMPHS PCT: 37 %
Lymphs Abs: 3.4 10*3/uL (ref 0.7–4.0)
MCH: 28.7 pg (ref 26.0–34.0)
MCHC: 30.4 g/dL (ref 30.0–36.0)
MCV: 94.4 fL (ref 80.0–100.0)
Monocytes Absolute: 0.5 10*3/uL (ref 0.1–1.0)
Monocytes Relative: 5 %
Neutro Abs: 5.1 10*3/uL (ref 1.7–7.7)
Neutrophils Relative %: 57 %
Platelets: 375 10*3/uL (ref 150–400)
RBC: 4.11 MIL/uL (ref 3.87–5.11)
RDW: 13.6 % (ref 11.5–15.5)
WBC: 9.1 10*3/uL (ref 4.0–10.5)
nRBC: 0 % (ref 0.0–0.2)

## 2018-12-05 LAB — COMPREHENSIVE METABOLIC PANEL
ALT: 19 U/L (ref 0–44)
AST: 19 U/L (ref 15–41)
Albumin: 4.2 g/dL (ref 3.5–5.0)
Alkaline Phosphatase: 45 U/L (ref 38–126)
Anion gap: 6 (ref 5–15)
BUN: 14 mg/dL (ref 6–20)
CO2: 27 mmol/L (ref 22–32)
CREATININE: 1.06 mg/dL — AB (ref 0.44–1.00)
Calcium: 8.6 mg/dL — ABNORMAL LOW (ref 8.9–10.3)
Chloride: 104 mmol/L (ref 98–111)
GFR calc Af Amer: >60 mL/min (ref >60)
GFR calc non Af Amer: >60 mL/min (ref >60)
GLUCOSE: 129 mg/dL — AB (ref 70–99)
Potassium: 3.9 mmol/L (ref 3.5–5.1)
Sodium: 137 mmol/L (ref 135–145)
Total Bilirubin: 0.2 mg/dL — ABNORMAL LOW (ref 0.3–1.2)
Total Protein: 7 g/dL (ref 6.5–8.1)

## 2018-12-05 LAB — LIPASE, BLOOD: Lipase: 29 U/L (ref 11–51)

## 2018-12-05 MED ORDER — SODIUM CHLORIDE 0.9 % IV BOLUS
1000.0000 mL | Freq: Once | INTRAVENOUS | Status: AC
  Administered 2018-12-05: 1000 mL via INTRAVENOUS

## 2018-12-05 MED ORDER — DICYCLOMINE HCL 10 MG/ML IM SOLN
20.0000 mg | Freq: Once | INTRAMUSCULAR | Status: AC
  Administered 2018-12-05: 20 mg via INTRAMUSCULAR
  Filled 2018-12-05: qty 2

## 2018-12-05 MED ORDER — PROCHLORPERAZINE EDISYLATE 10 MG/2ML IJ SOLN
10.0000 mg | Freq: Once | INTRAMUSCULAR | Status: AC
  Administered 2018-12-05: 10 mg via INTRAVENOUS
  Filled 2018-12-05: qty 2

## 2018-12-05 MED ORDER — IOPAMIDOL (ISOVUE-300) INJECTION 61%
100.0000 mL | Freq: Once | INTRAVENOUS | Status: AC | PRN
  Administered 2018-12-05: 100 mL via INTRAVENOUS

## 2018-12-05 MED ORDER — DIPHENHYDRAMINE HCL 50 MG/ML IJ SOLN
25.0000 mg | Freq: Once | INTRAMUSCULAR | Status: AC
  Administered 2018-12-05: 25 mg via INTRAVENOUS
  Filled 2018-12-05: qty 1

## 2018-12-05 MED ORDER — HYDROCORTISONE ACETATE 25 MG RE SUPP: 25.0000 mg | Freq: Two times a day (BID) | RECTAL | 0 refills | Status: DC

## 2018-12-05 NOTE — ED Notes (Signed)
Called on [facility] on call doctor for Dr. Conley RollsLe
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
## 2018-12-05 NOTE — ED Notes (Signed)
Patient verbalizes understanding of discharge instructions. Opportunity for questioning and answers were provided. Armband removed by staff, pt discharged from ED home via POV.  

## 2018-12-05 NOTE — ED Provider Notes (Signed)
[facility] EMERGENCY DEPARTMENT Provider Note   CSN: [id number] Arrival date & time: 12/05/18  1652     History   Chief Complaint Chief Complaint  Patient presents with  . Rectal Bleeding    HPI [name] is a 47 y.o. female.  HPI   Presents with rectal bleeding, today had BM and when went to wipe had bright red bleeding and as wiped it continued to bleed. Called after hours and told to come here.  Was still bleeding for about an hour.  Slowed down after an hour, wore pad. Slight rectal pain, left upper quadrant pain, started around time had to use bathroom No constipation or diarrhea Nausea, no vomiting No fevers Lightheadedness, mild Not on blood thinners  Dr. Nedra Hai with [facility] 8 years ago had bleeding and had emergent surgery? Colonoscopy in June, had polyp, had colonoscopy due to bleeding then but not as severe  Past Medical History:  Diagnosis Date  . Diabetes mellitus without complication (HCC)   . GERD (gastroesophageal reflux disease)   . Hypertension   . Ovarian cyst   . Thyroid disease     There are no active problems to display for this patient.   Past Surgical History:  Procedure Laterality Date  . ABDOMINAL HYSTERECTOMY    . BREAST SURGERY    . CESAREAN SECTION    . CHOLECYSTECTOMY    . ESOPHAGEAL DILATION    . KNEE SURGERY    . THYROIDECTOMY       OB History    Gravida  2   Para  2   Term      Preterm      AB      Living        SAB      TAB      Ectopic      Multiple      Live Births               Home Medications    Prior to Admission medications   Medication Sig Start Date End Date Taking? Authorizing Provider  benzonatate (TESSALON) 100 MG capsule Take 1 capsule (100 mg total) by mouth every 8 (eight) hours. 01/12/17   Law, Waylan Boga, PA-C  cephALEXin (KEFLEX) 500 MG capsule Take 1 capsule (500 mg total) 3 (three) times daily by mouth. 11/08/17   Rolan Bucco, MD  diazepam (VALIUM) 5  MG tablet Take 1 tablet (5 mg total) by mouth 2 (two) times daily. 03/12/18   Jacalyn Lefevre, MD  esomeprazole (NEXIUM) 40 MG capsule Take 40 mg by mouth daily.      [provider]  HYDROcodone-acetaminophen (NORCO/VICODIN) 5-325 MG tablet Take 1 tablet by mouth every 4 (four) hours as needed. 03/12/18   Jacalyn Lefevre, MD  hydrocortisone (ANUSOL-HC) 25 MG suppository Place 1 suppository (25 mg total) rectally 2 (two) times daily. 12/05/18   Alvira Monday, MD  ibuprofen (ADVIL,MOTRIN) 600 MG tablet Take 1 tablet (600 mg total) by mouth every 6 (six) hours as needed. 03/12/18   Jacalyn Lefevre, MD  Levothyroxine Sodium (SYNTHROID PO) Take by mouth.    [provider]  LISINOPRIL PO Take by mouth.    [provider]  methocarbamol (ROBAXIN) 500 MG tablet Take 1 tablet (500 mg total) by mouth every 8 (eight) hours as needed for muscle spasms. 03/25/18   Petrucelli, Samantha R, PA-C  naproxen (NAPROSYN) 500 MG tablet Take 1 tablet (500 mg total) by mouth 2 (  two) times daily. 03/25/18   Petrucelli, Samantha R, PA-C  omeprazole (PRILOSEC) 20 MG capsule Take 20 mg daily by mouth.    [provider]  oseltamivir (TAMIFLU) 75 MG capsule Take 1 capsule (75 mg total) by mouth every 12 (twelve) hours. 01/12/17   Law, Waylan Boga, PA-C  promethazine (PHENERGAN) 12.5 MG tablet Take 1 tablet (12.5 mg total) by mouth every 8 (eight) hours as needed for nausea or vomiting. 03/25/18   Petrucelli, Samantha R, PA-C  traMADol (ULTRAM) 50 MG tablet Take 1 tablet (50 mg total) every 6 (six) hours as needed by mouth. 11/08/17   Rolan Bucco, MD    Family History No family history on file.  Social History Social History   Tobacco Use  . Smoking status: Never Smoker  . Smokeless tobacco: Never Used  Substance Use Topics  . Alcohol use: Yes    Comment: occ  . Drug use: No     Allergies   Darvocet [propoxyphene n-acetaminophen]; Reglan [metoclopramide]; and Zofran   Review of  Systems Review of Systems  Constitutional: Negative for fever.  HENT: Negative for sore throat.   Eyes: Negative for visual disturbance.  Respiratory: Negative for cough and shortness of breath.   Cardiovascular: Negative for chest pain.  Gastrointestinal: Positive for abdominal pain, anal bleeding and nausea. Negative for constipation, diarrhea and vomiting.  Genitourinary: Negative for difficulty urinating.  Musculoskeletal: Negative for back pain and neck pain.  Skin: Negative for rash.  Neurological: Negative for syncope and headaches.     Physical Exam Updated Vital Signs BP (!) 148/75   Pulse 89   Temp 98.2 F (36.8 C) (Oral)   Resp 16   Ht 5\' 2"  (1.575 m)   Wt 104.3 kg   SpO2 100%   BMI 42.07 kg/m   Physical Exam Vitals signs and nursing note reviewed.  Constitutional:      General: She is not in acute distress.    Appearance: She is well-developed. She is not diaphoretic.  HENT:     Head: Normocephalic and atraumatic.  Eyes:     Conjunctiva/sclera: Conjunctivae normal.  Neck:     Musculoskeletal: Normal range of motion.  Cardiovascular:     Rate and Rhythm: Normal rate and regular rhythm.     Heart sounds: Normal heart sounds. No murmur. No friction rub. No gallop.   Pulmonary:     Effort: Pulmonary effort is normal. No respiratory distress.     Breath sounds: Normal breath sounds. No wheezing or rales.  Abdominal:     General: There is no distension.     Palpations: Abdomen is soft.     Tenderness: There is abdominal tenderness (left sided). There is no guarding.  Genitourinary:    Rectum: External hemorrhoid present.  Musculoskeletal:        General: No tenderness.  Skin:    General: Skin is warm and dry.     Findings: No erythema or rash.  Neurological:     Mental Status: She is alert and oriented to person, place, and time.      ED Treatments / Results  Labs (all labs ordered are listed, but only abnormal results are displayed) Labs  Reviewed  CBC WITH DIFFERENTIAL/PLATELET - Abnormal; Notable for the following components:      Result Value   Hemoglobin 11.8 (*)    All other components within normal limits  COMPREHENSIVE METABOLIC PANEL - Abnormal; Notable for the following components:   Glucose, Bld 129 (*)  Creatinine, Ser 1.06 (*)    Calcium 8.6 (*)    Total Bilirubin 0.2 (*)    All other components within normal limits  LIPASE, BLOOD    EKG None  Radiology Ct Abdomen Pelvis W Contrast  Result Date: 12/05/2018 CLINICAL DATA:  Gastrointestinal bleeding EXAM: CT ABDOMEN AND PELVIS WITH CONTRAST TECHNIQUE: Multidetector CT imaging of the abdomen and pelvis was performed using the standard protocol following bolus administration of intravenous contrast. CONTRAST:  ISOVUE-300 IOPAMIDOL (ISOVUE-300) INJECTION 61% COMPARISON:  CT abdomen pelvis 01/08/2014 FINDINGS: LOWER CHEST: There is no basilar pleural or apical pericardial effusion. HEPATOBILIARY: The hepatic contours and density are normal. There is no intra- or extrahepatic biliary dilatation. The gallbladder is normal. PANCREAS: The pancreatic parenchymal contours are normal and there is no ductal dilatation. There is no peripancreatic fluid collection. SPLEEN: Normal. ADRENALS/URINARY TRACT: --Adrenal glands: Normal. --Right kidney/ureter: No hydronephrosis, nephroureterolithiasis, perinephric stranding or solid renal mass. --Left kidney/ureter: No hydronephrosis, nephroureterolithiasis, perinephric stranding or solid renal mass. --Urinary bladder: Normal for degree of distention STOMACH/BOWEL: --Stomach/Duodenum: There is no hiatal hernia or other gastric abnormality. The duodenal course and caliber are normal. --Small bowel: No dilatation or inflammation. --Colon: No focal abnormality. --Appendix: Normal. VASCULAR/LYMPHATIC: Normal course and caliber of the major abdominal vessels. No abdominal or pelvic lymphadenopathy. REPRODUCTIVE: Status post hysterectomy.  No adnexal mass. MUSCULOSKELETAL. No bony spinal canal stenosis or focal osseous abnormality. OTHER: None. IMPRESSION: No acute abdominal or pelvic abnormality. Electronically Signed   By: Deatra Robinson M.D.   On: 12/05/2018 19:41    Procedures Procedures (including critical care time)  Medications Ordered in ED Medications  sodium chloride 0.9 % bolus 1,000 mL (0 mLs Intravenous Stopped 12/05/18 2031)  iopamidol (ISOVUE-300) 61 % injection 100 mL (100 mLs Intravenous Contrast Given 12/05/18 1914)  prochlorperazine (COMPAZINE) injection 10 mg (10 mg Intravenous Given 12/05/18 1946)  diphenhydrAMINE (BENADRYL) injection 25 mg (25 mg Intravenous Given 12/05/18 1946)  dicyclomine (BENTYL) injection 20 mg (20 mg Intramuscular Given 12/05/18 1946)     Initial Impression / Assessment and Plan / ED Course  I have reviewed the triage vital signs and the nursing notes.  Pertinent labs & imaging results that were available during my care of the patient were reviewed by me and considered in my medical decision making (see chart for details).     47yo female with history above presents with concern for rectal bleeding. Also reports left sided abdominal pain, has tenderness to LUQ and inferiorly, CT abdomen pelvis shows no evidence of diverticulitis, diverticulosis or other abnormalities. Given IV fluids, compazine, benadryl and bentyl with improvement. Suspect likely gastritis as etiology of pain.    Regarding rectal bleeding-hemoglobin is stable, vital signs are stable, hemorrhoids present on exam.  History most consistent with bleeding from hemorrhoids. Discussed with Dr. Bryn Gulling of GI at [facility]. He has reviewed her chart and prior colonoscopies and sees history of hemorrhoids but no history of diverticulosis.  Discussed with patient that most likely her symptoms are caused by bleeding hemmorhroids, however discussed reasons to return and importance of GI follow up. Patient discharged in stable condition  with understanding of reasons to return.   Final Clinical Impressions(s) / ED Diagnoses   Final diagnoses:  Rectal bleeding  Hemorrhoids, unspecified hemorrhoid type    ED Discharge Orders         Ordered    hydrocortisone (ANUSOL-HC) 25 MG suppository  2 times daily     12/05/18 2056  Alvira MondaySchlossman, Zykira Matlack, MD 12/06/18 [phone]

## 2018-12-05 NOTE — ED Notes (Signed)
Patient transported to CT 

## 2019-06-27 ENCOUNTER — Encounter (HOSPITAL_BASED_OUTPATIENT_CLINIC_OR_DEPARTMENT_OTHER): Payer: Self-pay | Admitting: Emergency Medicine

## 2019-06-27 ENCOUNTER — Emergency Department (HOSPITAL_BASED_OUTPATIENT_CLINIC_OR_DEPARTMENT_OTHER)
Admission: EM | Admit: 2019-06-27 | Discharge: 2019-06-27 | Disposition: A | Discharge status: Home / Self Care | Payer: BC Managed Care – PPO | Attending: Emergency Medicine | Admitting: Emergency Medicine

## 2019-06-27 ENCOUNTER — Other Ambulatory Visit: Payer: Self-pay

## 2019-06-27 DIAGNOSIS — M545 Low back pain, unspecified: Secondary | ICD-10-CM

## 2019-06-27 DIAGNOSIS — Z79899 Other long term (current) drug therapy: Secondary | ICD-10-CM | POA: Insufficient documentation

## 2019-06-27 DIAGNOSIS — I10 Essential (primary) hypertension: Secondary | ICD-10-CM | POA: Diagnosis not present

## 2019-06-27 DIAGNOSIS — Z7984 Long term (current) use of oral hypoglycemic drugs: Secondary | ICD-10-CM | POA: Diagnosis not present

## 2019-06-27 DIAGNOSIS — E1165 Type 2 diabetes mellitus with hyperglycemia: Secondary | ICD-10-CM | POA: Insufficient documentation

## 2019-06-27 DIAGNOSIS — R739 Hyperglycemia, unspecified: Secondary | ICD-10-CM

## 2019-06-27 DIAGNOSIS — E890 Postprocedural hypothyroidism: Secondary | ICD-10-CM | POA: Diagnosis not present

## 2019-06-27 DIAGNOSIS — R42 Dizziness and giddiness: Secondary | ICD-10-CM | POA: Diagnosis present

## 2019-06-27 LAB — COMPREHENSIVE METABOLIC PANEL
ALT: 26 U/L (ref 0–44)
AST: 20 U/L (ref 15–41)
Albumin: 4.5 g/dL (ref 3.5–5.0)
Alkaline Phosphatase: 48 U/L (ref 38–126)
Anion gap: 12 (ref 5–15)
BUN: 14 mg/dL (ref 6–20)
CO2: 25 mmol/L (ref 22–32)
Calcium: 9.2 mg/dL (ref 8.9–10.3)
Chloride: 98 mmol/L (ref 98–111)
Creatinine, Ser: 1.06 mg/dL — ABNORMAL HIGH (ref 0.44–1.00)
GFR calc Af Amer: >60 mL/min (ref >60)
GFR calc non Af Amer: >60 mL/min (ref >60)
Glucose, Bld: 262 mg/dL — ABNORMAL HIGH (ref 70–99)
Potassium: 3.4 mmol/L — ABNORMAL LOW (ref 3.5–5.1)
Sodium: 135 mmol/L (ref 135–145)
Total Bilirubin: 0.6 mg/dL (ref 0.3–1.2)
Total Protein: 7.2 g/dL (ref 6.5–8.1)

## 2019-06-27 LAB — CBC WITH DIFFERENTIAL/PLATELET
Abs Immature Granulocytes: 0.02 10*3/uL (ref 0.00–0.07)
Basophils Absolute: 0.1 10*3/uL (ref 0.0–0.1)
Basophils Relative: 1 %
Eosinophils Absolute: 0.1 10*3/uL (ref 0.0–0.5)
Eosinophils Relative: 1 %
HCT: 40.4 % (ref 36.0–46.0)
Hemoglobin: 12.9 g/dL (ref 12.0–15.0)
Immature Granulocytes: 0 %
Lymphocytes Relative: 35 %
Lymphs Abs: 2.8 10*3/uL (ref 0.7–4.0)
MCH: 29.7 pg (ref 26.0–34.0)
MCHC: 31.9 g/dL (ref 30.0–36.0)
MCV: 92.9 fL (ref 80.0–100.0)
Monocytes Absolute: 0.5 10*3/uL (ref 0.1–1.0)
Monocytes Relative: 6 %
Neutro Abs: 4.6 10*3/uL (ref 1.7–7.7)
Neutrophils Relative %: 57 %
Platelets: 349 10*3/uL (ref 150–400)
RBC: 4.35 MIL/uL (ref 3.87–5.11)
RDW: 12.3 % (ref 11.5–15.5)
WBC: 8 10*3/uL (ref 4.0–10.5)
nRBC: 0 % (ref 0.0–0.2)

## 2019-06-27 LAB — URINALYSIS, ROUTINE W REFLEX MICROSCOPIC
Bilirubin Urine: NEGATIVE
Glucose, UA: >=500 mg/dL — AB
Hgb urine dipstick: NEGATIVE
Ketones, ur: NEGATIVE mg/dL
Leukocytes,Ua: NEGATIVE
Nitrite: NEGATIVE
Protein, ur: NEGATIVE mg/dL
Specific Gravity, Urine: 1.01 (ref 1.005–1.030)
pH: 6 (ref 5.0–8.0)

## 2019-06-27 LAB — CBG MONITORING, ED
Glucose-Capillary: 251 mg/dL — ABNORMAL HIGH (ref 70–99)
Glucose-Capillary: 152 mg/dL — ABNORMAL HIGH (ref 70–99)

## 2019-06-27 LAB — URINALYSIS, MICROSCOPIC (REFLEX)

## 2019-06-27 LAB — LIPASE, BLOOD: Lipase: 22 U/L (ref 11–51)

## 2019-06-27 MED ORDER — LIDOCAINE 5 % EX PTCH: 1.0000 | MEDICATED_PATCH | CUTANEOUS | 0 refills | Status: AC

## 2019-06-27 MED ORDER — LACTATED RINGERS IV BOLUS
1000.0000 mL | Freq: Once | INTRAVENOUS | Status: AC
  Administered 2019-06-27: 11:00:00 1000 mL via INTRAVENOUS

## 2019-06-27 MED ORDER — PROMETHAZINE HCL 25 MG/ML IJ SOLN
INTRAMUSCULAR | Status: AC
  Administered 2019-06-27: 12.5 mg via INTRAVENOUS
  Filled 2019-06-27: qty 1

## 2019-06-27 MED ORDER — PROMETHAZINE HCL 12.5 MG PO TABS: 12.5000 mg | ORAL_TABLET | Freq: Three times a day (TID) | ORAL | 0 refills | Status: DC | PRN

## 2019-06-27 MED ORDER — DIAZEPAM 5 MG PO TABS
5.0000 mg | ORAL_TABLET | Freq: Once | ORAL | Status: AC
  Administered 2019-06-27: 5 mg via ORAL
  Filled 2019-06-27: qty 1

## 2019-06-27 MED ORDER — HYDROMORPHONE HCL 1 MG/ML IJ SOLN
1.0000 mg | Freq: Once | INTRAMUSCULAR | Status: AC
  Administered 2019-06-27: 1 mg via INTRAVENOUS
  Filled 2019-06-27: qty 1

## 2019-06-27 MED ORDER — KETOROLAC TROMETHAMINE 15 MG/ML IJ SOLN
15.0000 mg | Freq: Once | INTRAMUSCULAR | Status: AC
  Administered 2019-06-27: 14:00:00 15 mg via INTRAVENOUS
  Filled 2019-06-27: qty 1

## 2019-06-27 MED ORDER — DICLOFENAC SODIUM 1 % TD GEL: 4.0000 g | Freq: Four times a day (QID) | TRANSDERMAL | 1 refills | Status: AC

## 2019-06-27 MED ORDER — PROMETHAZINE HCL 25 MG/ML IJ SOLN
12.5000 mg | Freq: Once | INTRAMUSCULAR | Status: AC
  Administered 2019-06-27: 12.5 mg via INTRAVENOUS

## 2019-06-27 NOTE — ED Notes (Signed)
Pt reports improvement in anxiety and nausea, continues to report pain 5/10

## 2019-06-27 NOTE — ED Provider Notes (Signed)
[facility] EMERGENCY DEPARTMENT Provider Note   CSN: [id number] Arrival date & time: 06/27/19  1033     History   Chief Complaint Chief Complaint  Patient presents with  . Dizziness    HPI [name] is a 48 y.o. female.     Patient is a 48 year old female with a history of diabetes, hypertension, thyroid disease and GERD presenting today with multiple complaints.  She states over the last 2 to 3 days she has had general malaise, nausea, headache and just not feeling well.  She also states for the last week she has had left lower back pain which is getting worse.  She states it feels like a spasm and cramp in the left side of her back which is worse with standing walking, bending and will occasionally shoot into her leg.  Patient saw her doctor on 06/23/2019 for a new patient visit as well as for the pain.  They placed her on Flexeril which she is tried taking but states it does not help.  This morning she was having worsening nausea and her sugars were higher than normal and she had one episode of vomiting.  She states she does feel some dizziness when she stands and tries to walk but states this can happen when her sugar gets in the 200s.  She states normally her sugars are in the 150s.  She is still taking her medications as prescribed.  The history is provided by the patient.  Dizziness Quality:  Lightheadedness Severity:  Moderate Onset quality:  Gradual Duration:  3 days Timing:  Constant Progression:  Worsening Chronicity:  New Relieved by:  Nothing Worsened by:  Movement Associated symptoms: headaches, nausea, vomiting and weakness   Associated symptoms: no palpitations, no shortness of breath and no vision changes   Associated symptoms comment:  Left lower back pain that now is occasionally radiating to the leg.  No dysuria, frequency or urgency.  No cough, congestion, fever or chest pain. Risk factors: multiple medications and new medications   Risk factors  comment:  Hx of dm, htn   Past Medical History:  Diagnosis Date  . Diabetes mellitus without complication (HCC)   . GERD (gastroesophageal reflux disease)   . Hypertension   . Ovarian cyst   . Thyroid disease     There are no active problems to display for this patient.   Past Surgical History:  Procedure Laterality Date  . ABDOMINAL HYSTERECTOMY    . BREAST SURGERY    . CESAREAN SECTION    . CHOLECYSTECTOMY    . ESOPHAGEAL DILATION    . KNEE SURGERY    . THYROIDECTOMY       OB History    Gravida  2   Para  2   Term      Preterm      AB      Living        SAB      TAB      Ectopic      Multiple      Live Births               Home Medications    Prior to Admission medications   Medication Sig Start Date End Date Taking? Authorizing Provider  cefdinir (OMNICEF) 300 MG capsule  06/17/19   [provider]  cyclobenzaprine (FLEXERIL) 10 MG tablet  06/23/19   [provider]  fluconazole (DIFLUCAN) 150 MG tablet TK 1 T PO TODAY.  REPEAT WITH 1 T PO IN 2 DAYS 06/17/19   [provider]  hydrochlorothiazide (HYDRODIURIL) 25 MG tablet TK 1 T PO QD 06/15/19   [provider]  hydrOXYzine (ATARAX/VISTARIL) 25 MG tablet  06/15/19   [provider]  ibuprofen (ADVIL,MOTRIN) 600 MG tablet Take 1 tablet (600 mg total) by mouth every 6 (six) hours as needed. 03/12/18   Isla Pence, MD  levothyroxine (SYNTHROID) 112 MCG tablet TK 2 TS PO QD 06/15/19   [provider]  lisinopril (ZESTRIL) 10 MG tablet  06/20/19   [provider]  metFORMIN (GLUCOPHAGE) 1000 MG tablet  06/23/19   [provider]  naproxen (NAPROSYN) 500 MG tablet Take 1 tablet (500 mg total) by mouth 2 (two) times daily. 03/25/18   Petrucelli, Glynda Jaeger, PA-C  omeprazole (PRILOSEC) 40 MG capsule TK 1 C PO BID 06/15/19   [provider]  OZEMPIC, 0.25 OR 0.5 MG/DOSE, 2 MG/1.5ML SOPN INJ  0.25 MG DOSE  [place] ONCE A WK 06/23/19    [provider]  promethazine (PHENERGAN) 12.5 MG tablet Take 1 tablet (12.5 mg total) by mouth every 8 (eight) hours as needed for nausea or vomiting. 03/25/18   Petrucelli, Glynda Jaeger, PA-C  zolpidem (AMBIEN) 10 MG tablet  06/17/19   [provider]    Family History No family history on file.  Social History Social History   Tobacco Use  . Smoking status: Never Smoker  . Smokeless tobacco: Never Used  Substance Use Topics  . Alcohol use: Yes    Comment: occ  . Drug use: No     Allergies   Metoclopramide, Ondansetron, Darvocet [propoxyphene n-acetaminophen], Hydrocodone-acetaminophen, Oxycodone-acetaminophen, and Zofran   Review of Systems Review of Systems  Respiratory: Negative for shortness of breath.   Cardiovascular: Negative for palpitations.  Gastrointestinal: Positive for nausea and vomiting.  Neurological: Positive for dizziness, weakness and headaches.  All other systems reviewed and are negative.    Physical Exam Updated Vital Signs BP 134/76 (BP Location: Right Arm)   Pulse 89   Temp 98.7 F (37.1 C) (Oral)   Resp 16   Ht 5\' 2"  (1.575 m)   Wt 104.3 kg   SpO2 100%   BMI 42.07 kg/m   Physical Exam Vitals signs and nursing note reviewed.  Constitutional:      General: She is not in acute distress.    Appearance: She is well-developed. She is obese.  HENT:     Head: Normocephalic and atraumatic.  Eyes:     Pupils: Pupils are equal, round, and reactive to light.  Cardiovascular:     Rate and Rhythm: Regular rhythm. Tachycardia present.     Heart sounds: Normal heart sounds. No murmur. No friction rub.  Pulmonary:     Effort: Pulmonary effort is normal.     Breath sounds: Normal breath sounds. No wheezing or rales.  Abdominal:     General: Bowel sounds are normal. There is no distension.     Palpations: Abdomen is soft.     Tenderness: There is no abdominal tenderness. There is no guarding or rebound.  Musculoskeletal: Normal  range of motion.        General: Tenderness present.     Lumbar back: She exhibits tenderness, pain and spasm. She exhibits normal range of motion and no bony tenderness.       Back:     Right lower leg: No edema.     Left lower leg: No edema.  Comments: No edema  Skin:    General: Skin is warm and dry.     Capillary Refill: Capillary refill takes less than 2 seconds.     Findings: No rash.  Neurological:     General: No focal deficit present.     Mental Status: She is alert and oriented to person, place, and time. Mental status is at baseline.     Cranial Nerves: No cranial nerve deficit.  Psychiatric:        Mood and Affect: Mood normal.        Behavior: Behavior normal.        Thought Content: Thought content normal.      ED Treatments / Results  Labs (all labs ordered are listed, but only abnormal results are displayed) Labs Reviewed  COMPREHENSIVE METABOLIC PANEL - Abnormal; Notable for the following components:      Result Value   Potassium 3.4 (*)    Glucose, Bld 262 (*)    Creatinine, Ser 1.06 (*)    All other components within normal limits  URINALYSIS, ROUTINE W REFLEX MICROSCOPIC - Abnormal; Notable for the following components:   Glucose, UA >=500 (*)    All other components within normal limits  URINALYSIS, MICROSCOPIC (REFLEX) - Abnormal; Notable for the following components:   Bacteria, UA RARE (*)    All other components within normal limits  CBG MONITORING, ED - Abnormal; Notable for the following components:   Glucose-Capillary 251 (*)    All other components within normal limits  CBC WITH DIFFERENTIAL/PLATELET  LIPASE, BLOOD    EKG EKG Interpretation  Date/Time:  Monday June 27 2019 10:49:21 EDT Ventricular Rate:  91 PR Interval:    QRS Duration: 92 QT Interval:  367 QTC Calculation: 452 R Axis:   26 Text Interpretation:  Sinus rhythm Normal ECG No significant change since last tracing Confirmed by Gwyneth SproutPlunkett, Mani Celestin ([id number]) on 06/27/2019  11:03:25 AM   Radiology No results found.  Procedures Procedures (including critical care time)  Medications Ordered in ED Medications  promethazine (PHENERGAN) injection 12.5 mg (12.5 mg Intravenous Given 06/27/19 1135)  lactated ringers bolus 1,000 mL (0 mLs Intravenous Stopped 06/27/19 1229)  diazepam (VALIUM) tablet 5 mg (5 mg Oral Given 06/27/19 1124)     Initial Impression / Assessment and Plan / ED Course  I have reviewed the triage vital signs and the nursing notes.  Pertinent labs & imaging results that were available during my care of the patient were reviewed by me and considered in my medical decision making (see chart for details).       48 year old female presenting with multiple complaints today.  Patient is complaining that her blood sugar is higher than normal she is having some nausea and vomiting and left lower back pain.  Patient's left lower back pain seems to be musculoskeletal in nature.  She has no urinary symptoms or blood in her urine.  She has no abdominal pain or concern for an acute abdominal process as the cause of her pain.  Low suspicion for kidney stone at this time.  Patient has no infectious symptoms and is afebrile here.  Suspect her blood sugar being elevated may be causing some of her malaise and she was given IV fluids.  Patient is EKG is within normal limits.  She was given Phenergan for her nausea due to her multiple drug allergies and Valium for the muscle spasm in her back.  Will reevaluate.  Blood sugar here is 262 with a  normal anion gap and low suspicion for DKA.  Creatinine is baseline.  Lipase within normal limits.  And CBC is unchanged.  1:33 PM Pt feeling better after fluids but still having back pain.  Will try toradol in light of all pt's allergies.  Repeat CBG 152.  2:17 PM Pt still having pain even with toradol and was given dilaudid.  Pt is allergic to oral narcotics and was d/ced home with lidocaine patch, voltaren gel and encouraged  close f/u with PCP.  Final Clinical Impressions(s) / ED Diagnoses   Final diagnoses:  Hyperglycemia  Acute left-sided low back pain without sciatica    ED Discharge Orders         Ordered    lidocaine (LIDODERM) 5 %  Every 24 hours     06/27/19 1422    diclofenac sodium (VOLTAREN) 1 % GEL  4 times daily     06/27/19 1422           Gwyneth SproutPlunkett, Nyzaiah Kai, MD 06/27/19 1422

## 2019-06-27 NOTE — ED Notes (Signed)
Ambulated to bathroom with steady gait to provide urine specimen 

## 2019-06-27 NOTE — ED Triage Notes (Signed)
Pt states she started a new injectable med for diabetes.  Pt states this morning she became dizzy, n/v, and elevated heart rate.  HR 90 upon registration pulse ox.  Pt states she is being treated for inner ear disorder and is having back pain.  No dysuria.

## 2019-06-27 NOTE — Discharge Instructions (Signed)
Continue taking your normal meds as prescribed.  Use the lidocaine patches and gel for the pain.  You can also take tylenol.  If pain continues you may need physical therapy or MRI in the future.  If you develop weakness of your leg, unable to control your bowel movements or your urine  return immediately.

## 2019-06-27 NOTE — ED Notes (Signed)
MD at bedside. 

## 2019-09-27 ENCOUNTER — Other Ambulatory Visit: Payer: Self-pay

## 2019-09-27 DIAGNOSIS — Z20822 Contact with and (suspected) exposure to covid-19: Secondary | ICD-10-CM

## 2019-09-29 LAB — NOVEL CORONAVIRUS, NAA: SARS-CoV-2, NAA: NOT DETECTED

## 2020-07-11 ENCOUNTER — Emergency Department (HOSPITAL_BASED_OUTPATIENT_CLINIC_OR_DEPARTMENT_OTHER)
Admission: EM | Admit: 2020-07-11 | Discharge: 2020-07-11 | Disposition: A | Discharge status: Home / Self Care | Payer: BC Managed Care – PPO | Attending: Emergency Medicine | Admitting: Emergency Medicine

## 2020-07-11 ENCOUNTER — Other Ambulatory Visit: Payer: Self-pay

## 2020-07-11 ENCOUNTER — Encounter (HOSPITAL_BASED_OUTPATIENT_CLINIC_OR_DEPARTMENT_OTHER): Payer: Self-pay

## 2020-07-11 ENCOUNTER — Emergency Department (HOSPITAL_BASED_OUTPATIENT_CLINIC_OR_DEPARTMENT_OTHER): Payer: BC Managed Care – PPO

## 2020-07-11 DIAGNOSIS — E119 Type 2 diabetes mellitus without complications: Secondary | ICD-10-CM | POA: Diagnosis not present

## 2020-07-11 DIAGNOSIS — Z7984 Long term (current) use of oral hypoglycemic drugs: Secondary | ICD-10-CM | POA: Insufficient documentation

## 2020-07-11 DIAGNOSIS — Z79899 Other long term (current) drug therapy: Secondary | ICD-10-CM | POA: Insufficient documentation

## 2020-07-11 DIAGNOSIS — R220 Localized swelling, mass and lump, head: Secondary | ICD-10-CM | POA: Diagnosis present

## 2020-07-11 DIAGNOSIS — I10 Essential (primary) hypertension: Secondary | ICD-10-CM | POA: Insufficient documentation

## 2020-07-11 DIAGNOSIS — L03213 Periorbital cellulitis: Secondary | ICD-10-CM | POA: Insufficient documentation

## 2020-07-11 LAB — COMPREHENSIVE METABOLIC PANEL
ALT: 17 U/L (ref 0–44)
AST: 17 U/L (ref 15–41)
Albumin: 4 g/dL (ref 3.5–5.0)
Alkaline Phosphatase: 45 U/L (ref 38–126)
Anion gap: 9 (ref 5–15)
BUN: 8 mg/dL (ref 6–20)
CO2: 27 mmol/L (ref 22–32)
Calcium: 8.5 mg/dL — ABNORMAL LOW (ref 8.9–10.3)
Chloride: 100 mmol/L (ref 98–111)
Creatinine, Ser: 0.86 mg/dL (ref 0.44–1.00)
GFR calc Af Amer: >60 mL/min (ref >60)
GFR calc non Af Amer: >60 mL/min (ref >60)
Glucose, Bld: 137 mg/dL — ABNORMAL HIGH (ref 70–99)
Potassium: 3.7 mmol/L (ref 3.5–5.1)
Sodium: 136 mmol/L (ref 135–145)
Total Bilirubin: 1 mg/dL (ref 0.3–1.2)
Total Protein: 6.7 g/dL (ref 6.5–8.1)

## 2020-07-11 LAB — CBC WITH DIFFERENTIAL/PLATELET
Abs Immature Granulocytes: 0.01 10*3/uL (ref 0.00–0.07)
Basophils Absolute: 0.1 10*3/uL (ref 0.0–0.1)
Basophils Relative: 1 %
Eosinophils Absolute: 0.1 10*3/uL (ref 0.0–0.5)
Eosinophils Relative: 1 %
HCT: 39.3 % (ref 36.0–46.0)
Hemoglobin: 12.5 g/dL (ref 12.0–15.0)
Immature Granulocytes: 0 %
Lymphocytes Relative: 34 %
Lymphs Abs: 2.5 10*3/uL (ref 0.7–4.0)
MCH: 29.6 pg (ref 26.0–34.0)
MCHC: 31.8 g/dL (ref 30.0–36.0)
MCV: 93.1 fL (ref 80.0–100.0)
Monocytes Absolute: 0.4 10*3/uL (ref 0.1–1.0)
Monocytes Relative: 6 %
Neutro Abs: 4.3 10*3/uL (ref 1.7–7.7)
Neutrophils Relative %: 58 %
Platelets: 342 10*3/uL (ref 150–400)
RBC: 4.22 MIL/uL (ref 3.87–5.11)
RDW: 12.8 % (ref 11.5–15.5)
WBC: 7.3 10*3/uL (ref 4.0–10.5)
nRBC: 0 % (ref 0.0–0.2)

## 2020-07-11 LAB — CBG MONITORING, ED: Glucose-Capillary: 138 mg/dL — ABNORMAL HIGH (ref 70–99)

## 2020-07-11 MED ORDER — ACETAMINOPHEN 500 MG PO TABS
1000.0000 mg | ORAL_TABLET | Freq: Once | ORAL | Status: AC
  Administered 2020-07-11: 1000 mg via ORAL
  Filled 2020-07-11: qty 2

## 2020-07-11 MED ORDER — IOHEXOL 300 MG/ML  SOLN
100.0000 mL | Freq: Once | INTRAMUSCULAR | Status: AC | PRN
  Administered 2020-07-11: 100 mL via INTRAVENOUS

## 2020-07-11 MED ORDER — PROMETHAZINE HCL 12.5 MG PO TABS
12.5000 mg | ORAL_TABLET | Freq: Three times a day (TID) | ORAL | 0 refills | Status: DC | PRN
Start: 2020-07-11 — End: 2021-07-19

## 2020-07-11 MED ORDER — MECLIZINE HCL 25 MG PO TABS
25.0000 mg | ORAL_TABLET | Freq: Once | ORAL | Status: AC
  Administered 2020-07-11: 25 mg via ORAL
  Filled 2020-07-11: qty 1

## 2020-07-11 MED ORDER — FLUCONAZOLE 150 MG PO TABS
150.0000 mg | ORAL_TABLET | Freq: Once | ORAL | 0 refills | Status: AC
Start: 2020-07-11 — End: 2020-07-11

## 2020-07-11 MED ORDER — SODIUM CHLORIDE 0.9 % IV BOLUS
1000.0000 mL | Freq: Once | INTRAVENOUS | Status: AC
  Administered 2020-07-11: 1000 mL via INTRAVENOUS

## 2020-07-11 MED ORDER — CEFDINIR 300 MG PO CAPS
300.0000 mg | ORAL_CAPSULE | Freq: Two times a day (BID) | ORAL | 0 refills | Status: AC
Start: 2020-07-11 — End: 2020-07-18

## 2020-07-11 MED ORDER — CLINDAMYCIN HCL 300 MG PO CAPS
300.0000 mg | ORAL_CAPSULE | Freq: Three times a day (TID) | ORAL | 0 refills | Status: AC
Start: 2020-07-11 — End: 2020-07-18

## 2020-07-11 MED ORDER — PROMETHAZINE HCL 25 MG PO TABS
12.5000 mg | ORAL_TABLET | Freq: Once | ORAL | Status: AC
  Administered 2020-07-11: 12.5 mg via ORAL
  Filled 2020-07-11: qty 1

## 2020-07-11 NOTE — Discharge Instructions (Signed)
As we discussed today if you develop fevers, worsening swelling, worsening vision changes, or have any other concerns please seek additional medical care and evaluation.  Please keep your primary care doctor appointment for recheck on Friday.  Please use Tylenol as needed for pain.  If you are taking Tylenol and that is not controlling your pain then you may add in ibuprofen as listed below.  Please take Ibuprofen (Advil, motrin) and Tylenol (acetaminophen) to relieve your pain.  You may take up to 600 MG (3 pills) of normal strength ibuprofen every 8 hours as needed.   You make take tylenol, up to 1,000 mg (two extra strength pills) every 8 hours.  Do not take more than 3,000 mg tylenol in a 24 hour period.  Please check all medication labels as many medications such as pain and cold medications may contain tylenol.  Do not drink alcohol while taking these medications.  Do not take other NSAID'S while taking ibuprofen (such as aleve or naproxen).  Please take ibuprofen with food to decrease stomach upset.  You may have diarrhea from the antibiotics.  It is very important that you continue to take the antibiotics even if you get diarrhea unless a medical professional tells you that you may stop taking them.  If you stop too early the bacteria you are being treated for will become stronger and you may need different, more powerful antibiotics that have more side effects and worsening diarrhea.  Please stay well hydrated and consider probiotics as they may decrease the severity of your diarrhea.

## 2020-07-11 NOTE — ED Notes (Signed)
ED Provider at bedside. 

## 2020-07-11 NOTE — ED Provider Notes (Signed)
[facility] EMERGENCY DEPARTMENT Provider Note   CSN: [id number] Arrival date & time: 07/11/20  1149     History Chief Complaint  Patient presents with  . Facial Swelling    [name] is a 49 y.o. female with a past medical history of diabetes, GERD, hypertension, thyroid disease, who presents today for evaluation of multiple complaints. She reports that this morning since she woke up she has had swelling on the left side of her nose with dizziness, nausea and feeling like her vision in her left eye is slightly blurred with swelling around the eye.  She denies any fevers.  She denies any recent trauma.  She states that she is not missing any portion of her vision, rather that it is not as sharp as usual only out of her left eye.  She does not get any chiropractic adjustments.  She states that she has had a little bit of pain in that area over the past few days.  She originally went to her dentist today for routine cleaning and they directed her here to the emergency room.  She denies any dental problems or sinus issues.  No similar in the past.  No headache.  No vomiting.  She states that her dizziness is not causing her to fall, rather feels like she has to catch her self while walking.   No headache, neck pain, cough, shob, CP or abd pain.   HPI     Past Medical History:  Diagnosis Date  . Diabetes mellitus without complication (HCC)   . GERD (gastroesophageal reflux disease)   . Hypertension   . Ovarian cyst   . Thyroid disease     There are no problems to display for this patient.   Past Surgical History:  Procedure Laterality Date  . ABDOMINAL HYSTERECTOMY    . BREAST SURGERY    . CESAREAN SECTION    . CHOLECYSTECTOMY    . ESOPHAGEAL DILATION    . KNEE SURGERY    . THYROIDECTOMY       OB History    Gravida  2   Para  2   Term      Preterm      AB      Living        SAB      TAB      Ectopic      Multiple      Live Births               No family history on file.  Social History   Tobacco Use  . Smoking status: Never Smoker  . Smokeless tobacco: Never Used  Vaping Use  . Vaping Use: Never used  Substance Use Topics  . Alcohol use: Yes    Comment: occ  . Drug use: No    Home Medications Prior to Admission medications   Medication Sig Start Date End Date Taking? Authorizing Provider  amLODipine (NORVASC) 10 MG tablet Take 10 mg by mouth daily. 04/30/20   [provider]  cefdinir (OMNICEF) 300 MG capsule Take 1 capsule (300 mg total) by mouth 2 (two) times daily for 7 days. 07/11/20 07/18/20  Cristina Gong, PA-C  clindamycin (CLEOCIN) 300 MG capsule Take 1 capsule (300 mg total) by mouth 3 (three) times daily for 7 days. 07/11/20 07/18/20  Cristina Gong, PA-C  diclofenac sodium (VOLTAREN) 1 % GEL Apply 4 g topically 4 (four) times daily. 06/27/19   Gwyneth Sprout,  MD  hydrochlorothiazide (HYDRODIURIL) 25 MG tablet TK 1 T PO QD 06/15/19   [provider]  hydrOXYzine (ATARAX/VISTARIL) 25 MG tablet  06/15/19   [provider]  ibuprofen (ADVIL,MOTRIN) 600 MG tablet Take 1 tablet (600 mg total) by mouth every 6 (six) hours as needed. 03/12/18   Jacalyn LefevreHaviland, Julie, MD  levothyroxine (SYNTHROID) 112 MCG tablet TK 2 TS PO QD 06/15/19   [provider]  lidocaine (LIDODERM) 5 % Place 1 patch onto the skin daily. Remove & Discard patch within 12 hours or as directed by MD 06/27/19   Gwyneth SproutPlunkett, Whitney, MD  lisinopril (ZESTRIL) 10 MG tablet  06/20/19   [provider]  metFORMIN (GLUCOPHAGE) 1000 MG tablet  06/23/19   [provider]  naproxen (NAPROSYN) 500 MG tablet Take 1 tablet (500 mg total) by mouth 2 (two) times daily. 03/25/18   Petrucelli, Pleas KochSamantha R, PA-C  omeprazole (PRILOSEC) 40 MG capsule TK 1 C PO BID 06/15/19   [provider]  OZEMPIC, 0.25 OR 0.5 MG/DOSE, 2 MG/1.5ML SOPN INJ  0.25 MG DOSE  [place] ONCE A WK 06/23/19   [provider]    promethazine (PHENERGAN) 12.5 MG tablet Take 1 tablet (12.5 mg total) by mouth every 8 (eight) hours as needed for nausea or vomiting. 07/11/20   Cristina GongHammond, Moishe Schellenberg W, PA-C  traZODone (DESYREL) 50 MG tablet Take 50 mg by mouth at bedtime. 02/24/20   [provider]  zolpidem (AMBIEN) 10 MG tablet  06/17/19   [provider]    Allergies    Metoclopramide, Ondansetron, Darvocet [propoxyphene n-acetaminophen], Hydrocodone-acetaminophen, and Zofran  Review of Systems   Review of Systems  Constitutional: Negative for chills and fever.  HENT: Positive for facial swelling. Negative for ear discharge, ear pain, sinus pressure, sinus pain, sore throat and trouble swallowing.   Respiratory: Negative for cough, chest tightness and shortness of breath.   Cardiovascular: Negative for chest pain.  Gastrointestinal: Positive for nausea. Negative for abdominal pain, diarrhea and vomiting.  Genitourinary: Negative for dysuria and pelvic pain.  Musculoskeletal: Negative for back pain and neck pain.  Skin: Negative for color change, rash and wound.  Neurological: Positive for dizziness. Negative for light-headedness and headaches.  Psychiatric/Behavioral: Negative for confusion.  All other systems reviewed and are negative.   Physical Exam Updated Vital Signs BP 124/78 (BP Location: Right Arm)   Pulse 88   Temp 98.2 F (36.8 C) (Oral)   Resp 20   Ht 5\' 2"  (1.575 m)   Wt 100.2 kg   SpO2 99%   BMI 40.42 kg/m   Physical Exam Vitals and nursing note reviewed.  Constitutional:      General: She is not in acute distress.    Appearance: She is well-developed. She is not diaphoretic.  HENT:     Head: Normocephalic and atraumatic.     Comments: Mild edema around the left sided bridge of the nose extending under the left eye.  TTP with out fluctuance.  No pain with EOM.  No photophobia or consensual photophobia    Right Ear: Tympanic membrane, ear canal and external ear normal.      Left Ear: Tympanic membrane, ear canal and external ear normal.     Nose: No congestion or rhinorrhea.     Mouth/Throat:     Mouth: Mucous membranes are moist.     Tongue: No lesions.     Pharynx: Oropharynx is clear. Uvula midline. No pharyngeal swelling or posterior oropharyngeal erythema.  Eyes:     General: No scleral icterus.       Right eye: No discharge.        Left eye: No discharge.     Conjunctiva/sclera: Conjunctivae normal.  Cardiovascular:     Rate and Rhythm: Normal rate and regular rhythm.     Pulses: Normal pulses.     Heart sounds: Normal heart sounds.  Pulmonary:     Effort: Pulmonary effort is normal. No respiratory distress.     Breath sounds: Normal breath sounds. No stridor.  Abdominal:     General: There is no distension.  Musculoskeletal:        General: No deformity.     Cervical back: Full passive range of motion without pain, normal range of motion and neck supple.  Lymphadenopathy:     Cervical: No cervical adenopathy.  Skin:    General: Skin is warm and dry.  Neurological:     Mental Status: She is alert.     Motor: No abnormal muscle tone.     Comments: Mental Status:  Alert, oriented, thought content appropriate, able to give a coherent history. Speech fluent without evidence of aphasia. Able to follow 2 step commands without difficulty.  Cranial Nerves:  II:  Peripheral visual fields grossly normal, pupils equal, round, reactive to light.  No consensual photophobia.  III,IV, VI: ptosis not present, extra-ocular motions intact bilaterally  V,VII: smile symmetric, facial light touch sensation equal VIII: hearing grossly normal to voice  X: uvula elevates symmetrically  XI: bilateral shoulder shrug symmetric and strong XII: midline tongue extension without fassiculations Motor:  Normal tone. 5/5 in upper and lower extremities bilaterally including strong and equal grip strength and dorsiflexion/plantar flexion Cerebellar: normal finger-to-nose  with bilateral upper extremities Gait: Normal gait.  Occasionally reaches out to steady her self while walking with out frank ataxia.  CV: distal pulses palpable throughout    Psychiatric:        Mood and Affect: Mood normal.        Behavior: Behavior normal.     ED Results / Procedures / Treatments   Labs (all labs ordered are listed, but only abnormal results are displayed) Labs Reviewed  COMPREHENSIVE METABOLIC PANEL - Abnormal; Notable for the following components:      Result Value   Glucose, Bld 137 (*)    Calcium 8.5 (*)    All other components within normal limits  CBG MONITORING, ED - Abnormal; Notable for the following components:   Glucose-Capillary 138 (*)    All other components within normal limits  CBC WITH DIFFERENTIAL/PLATELET    EKG None  Radiology CT Head Wo Contrast  Result Date: 07/11/2020 CLINICAL DATA:  Cellulitis.  Inflammatory changes EXAM: CT HEAD WITHOUT CONTRAST TECHNIQUE: Contiguous axial images were obtained from the base of the skull through the vertex without intravenous contrast. COMPARISON:  CT head without contrast 03/12/2018. FINDINGS: Brain: Study is somewhat degraded by patient motion. No acute infarct, hemorrhage, or mass lesion is present. No significant white matter lesions are present. The ventricles are of normal size. No significant extraaxial fluid collection is present. The brainstem and cerebellum are within normal limits. Vascular: No hyperdense vessel or unexpected calcification. Skull: Calvarium is intact. No focal lytic or blastic lesions are present. No significant extracranial soft tissue lesion is present. Sinuses/Orbits: The paranasal sinuses and mastoid air cells are clear. Known left periorbital and nasal cellulitis does not extend into the sinuses or intracranial IMPRESSION: 1. Known left periorbital and  nasal cellulitis does not extend into the sinuses or intracranial. 2. Normal CT appearance of the brain. Electronically Signed    By: Marin Roberts M.D.   On: 07/11/2020 15:44   CT Maxillofacial W Contrast  Result Date: 07/11/2020 CLINICAL DATA:  Swelling to left side of the face blurred vision in the left eye. EXAM: CT MAXILLOFACIAL WITH CONTRAST TECHNIQUE: Multidetector CT imaging of the maxillofacial structures was performed with intravenous contrast. Multiplanar CT image reconstructions were also generated. CONTRAST:  OMNIPAQUE IOHEXOL 300 MG/ML  SOLN COMPARISON:  None. FINDINGS: Osseous: No acute osseous abnormalities are present. No acute or healing fracture is present. No focal lytic lesions are present. Orbits: Left periorbital soft tissue swelling is noted. This extends into the left nasal fold. Globes are within normal limits. Lacrimal gland is normal. No postseptal inflammatory changes are present. Optic nerve is normal. Cavernous sinus is normal. Sinuses: The paranasal sinuses and mastoid air cells are clear. Soft tissues: Asymmetric soft tissue inflammatory changes are present in the left side of the nose. No abscess is present. Soft tissues the face and upper neck are otherwise within normal limits. Limited intracranial: Within normal limits. IMPRESSION: 1. Left periorbital soft tissue swelling compatible with cellulitis. Inflammatory changes extend into the nose. 2. No postseptal inflammatory changes or abscess. Electronically Signed   By: Marin Roberts M.D.   On: 07/11/2020 15:41    Procedures Procedures (including critical care time)  Medications Ordered in ED Medications  meclizine (ANTIVERT) tablet 25 mg (25 mg Oral Given 07/11/20 1332)  sodium chloride 0.9 % bolus 1,000 mL ( Intravenous Stopped 07/11/20 1444)  iohexol (OMNIPAQUE) 300 MG/ML solution 100 mL (100 mLs Intravenous Contrast Given 07/11/20 1511)  acetaminophen (TYLENOL) tablet 1,000 mg (1,000 mg Oral Given 07/11/20 1603)  promethazine (PHENERGAN) tablet 12.5 mg (12.5 mg Oral Given 07/11/20 1604)    ED Course  I have reviewed  the triage vital signs and the nursing notes.  Pertinent labs & imaging results that were available during my care of the patient were reviewed by me and considered in my medical decision making (see chart for details).    MDM Rules/Calculators/A&P                         Patient is a 49 year old woman who presents today for evaluation of left-sided facial swelling and feeling dizzy.  On my exam she has mild edema of the left side of the nose, she is neurovascularly intact aside from her occasionally feeling the need to hold onto something to steady herself while she walks.  Given concern for infection with neurologic symptoms labs are obtained and reviewed.  CBC and CMP are unremarkable.  Glucose is well controlled at 137.  She is afebrile, not tachycardic or tachypneic.  CT head was obtained without evidence of infection, intracranial hemorrhage or other acute abnormalities.  CT max face with contrast was obtained showing soft tissue edema consistent with a left-sided preseptal/periorbital cellulitis without evidence of deep spread.   His nausea was attempted to be treated in the emergency room with Antivert, that did not provide adequate relief therefore she was given Phenergan.  She was given Tylenol for pain.  Additionally she was given IV fluids and instructed to hold her Metformin for 3 days as she got IV contrast today.  She states her understanding.  She is given a prescription for Phenergan at home.  We will treat with clindamycin and Omnicef.  She requested  a prescription for Diflucan for when she finishes her antibiotics as she normally gets a yeast infection and this was given.  Recommended PCP follow-up, she already has an appointment scheduled for 2 days.  Return precautions were discussed with patient who states their understanding.  At the time of discharge patient denied any unaddressed complaints or concerns.  Patient is agreeable for discharge home.  Note: Portions of this report may  have been transcribed using voice recognition software. Every effort was made to ensure accuracy; however, inadvertent computerized transcription errors may be present  Final Clinical Impression(s) / ED Diagnoses Final diagnoses:  Periorbital cellulitis of left eye    Rx / DC Orders ED Discharge Orders         Ordered    promethazine (PHENERGAN) 12.5 MG tablet  Every 8 hours PRN     Discontinue  Reprint     07/11/20 1616    fluconazole (DIFLUCAN) 150 MG tablet   Once     Reprint     07/11/20 1616    clindamycin (CLEOCIN) 300 MG capsule  3 times daily     Discontinue  Reprint     07/11/20 1616    cefdinir (OMNICEF) 300 MG capsule  2 times daily     Discontinue  Reprint     07/11/20 1616           Cristina Gong, [place] 07/12/20 0013    Cathren Laine, MD 07/12/20 [phone]

## 2020-07-11 NOTE — ED Notes (Addendum)
Left side facial swelling  X  2 days , has a hard place left side  Nose that   Came up this am  Also states she is dizzy and  Has blurred vision

## 2020-07-11 NOTE — ED Triage Notes (Signed)
Pt c/o pimple like area to left side of nose x 2-3 days-today she work with redness, swelling at site and below left eye-c/o blurred vision, nausea and "feeling off balance" since 9am-NAD-steady gait

## 2021-05-16 IMAGING — CT CT HEAD W/O CM
3 of 6 series · 14 of 47 positions shown, 17 images · non-contrast
Comparison: CT head without contrast 03/12/2018.

CLINICAL DATA: Cellulitis.  Inflammatory changes

EXAM:
CT HEAD WITHOUT CONTRAST
TECHNIQUE: Contiguous axial images were obtained from the base of the skull
through the vertex without intravenous contrast.

[Series 2: head wo · axial · 0.41mm/px · z∈[-153,-28]mm · 9 of 31 slices shown, 12 images]
[im 4/31  brain]
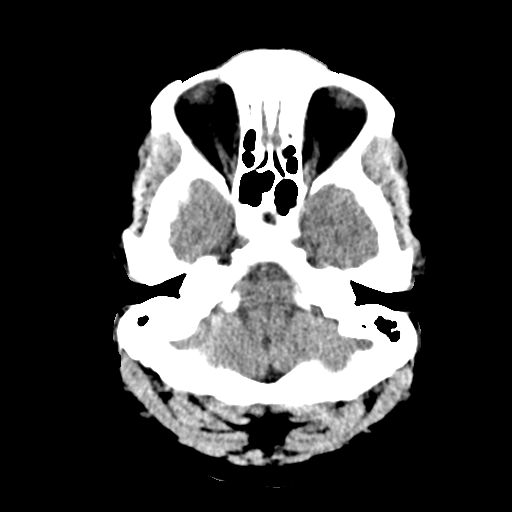
[im 4/31  bone]
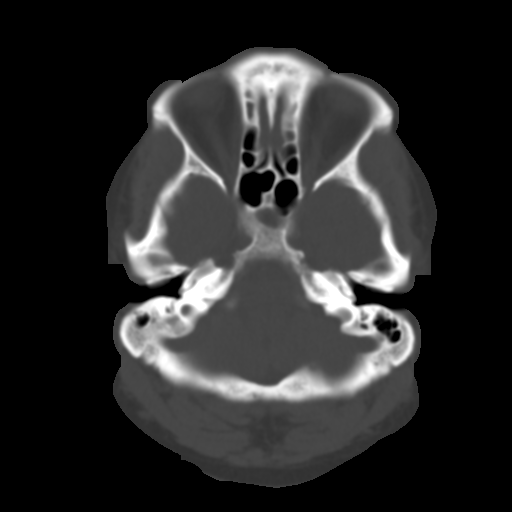
[im 7/31  brain]
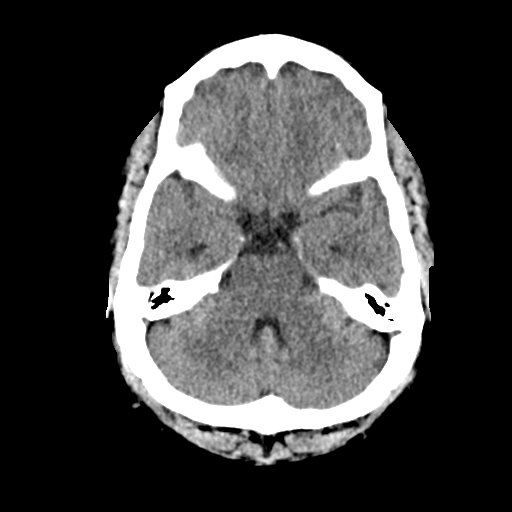
[im 10/31  brain]
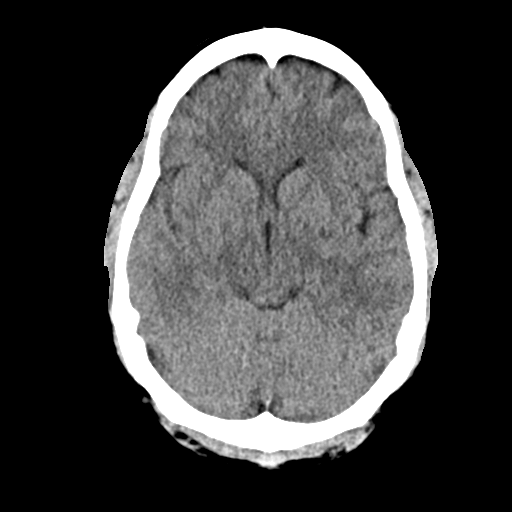
[im 13/31  brain]
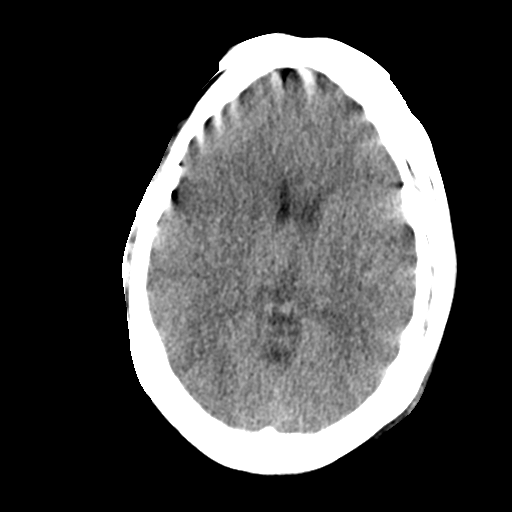
[im 16/31  brain]
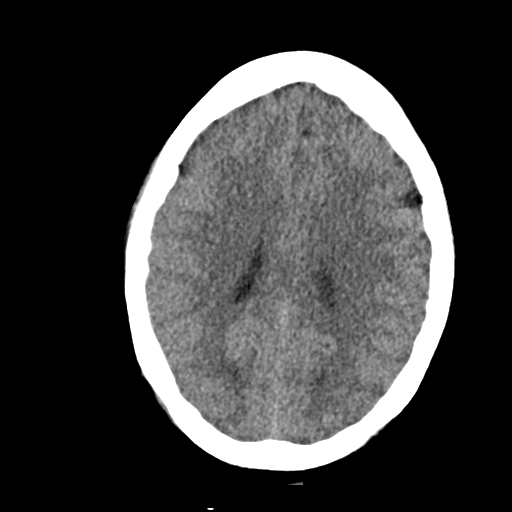
[im 16/31  bone]
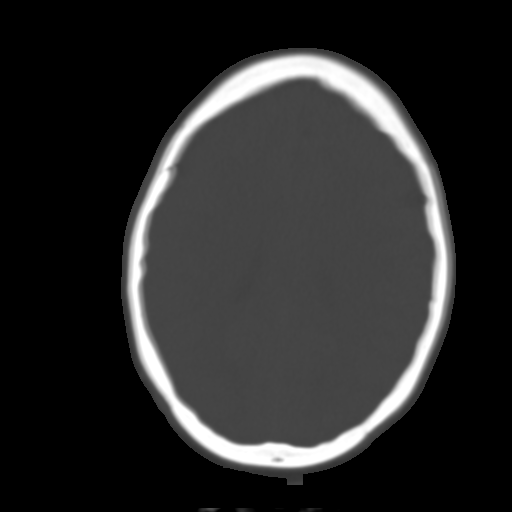
[im 19/31  brain]
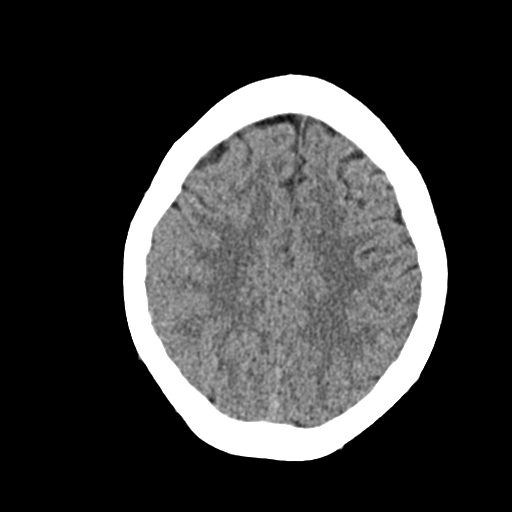
[im 23/31  brain]
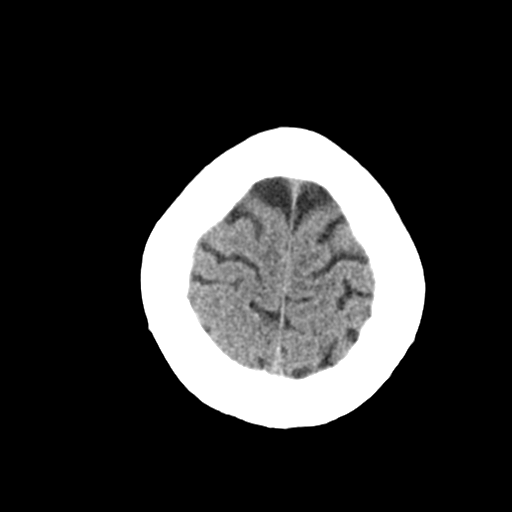
[im 26/31  brain]
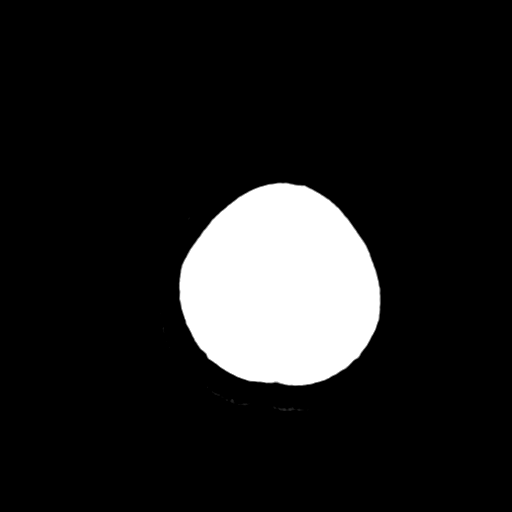
[im 29/31  brain]
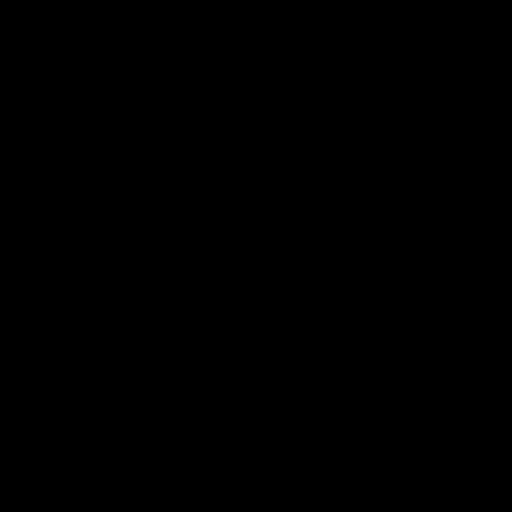
[im 29/31  bone]
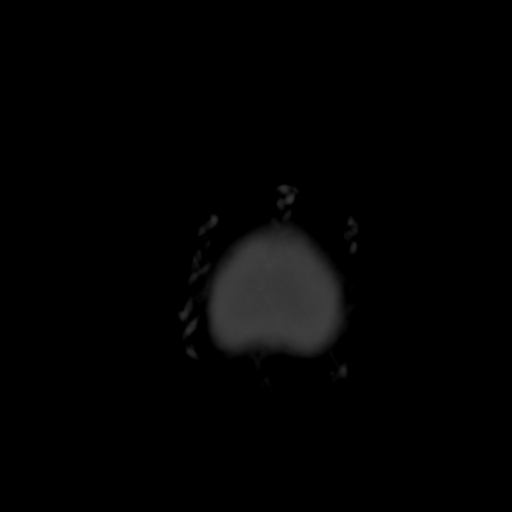

[Series 4: coronal soft · coronal · 0.30mm/px · 3 of 69 slices shown]
[im 18/69  brain]
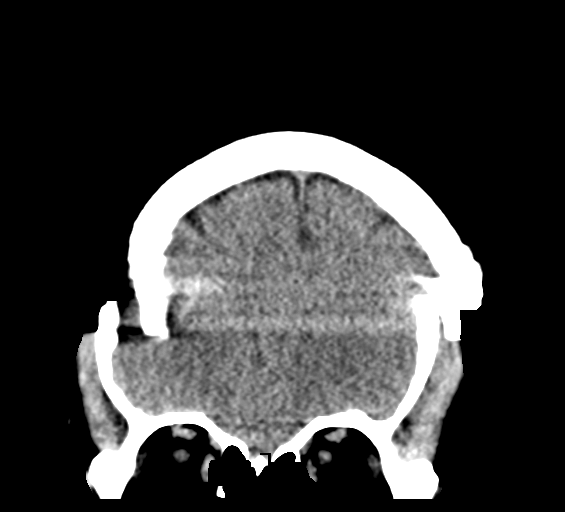
[im 35/69  brain]
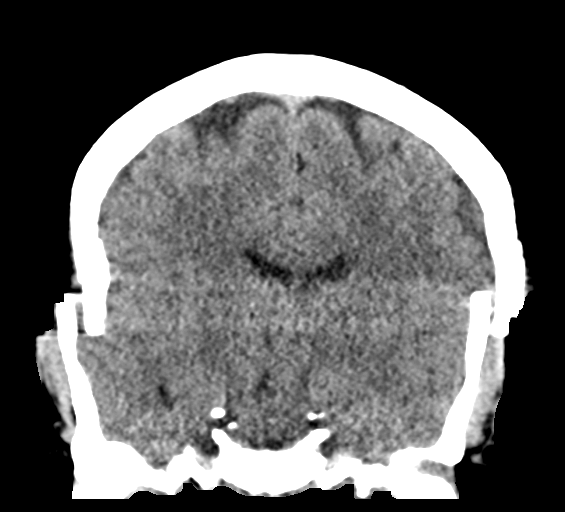
[im 52/69  brain]
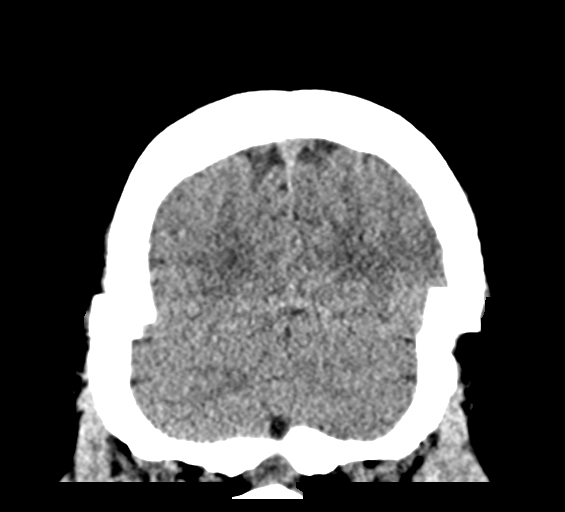

[Series 9: sag soft · sagittal · 0.18mm/px · 2 of 53 slices shown]
[im 18/53  brain]
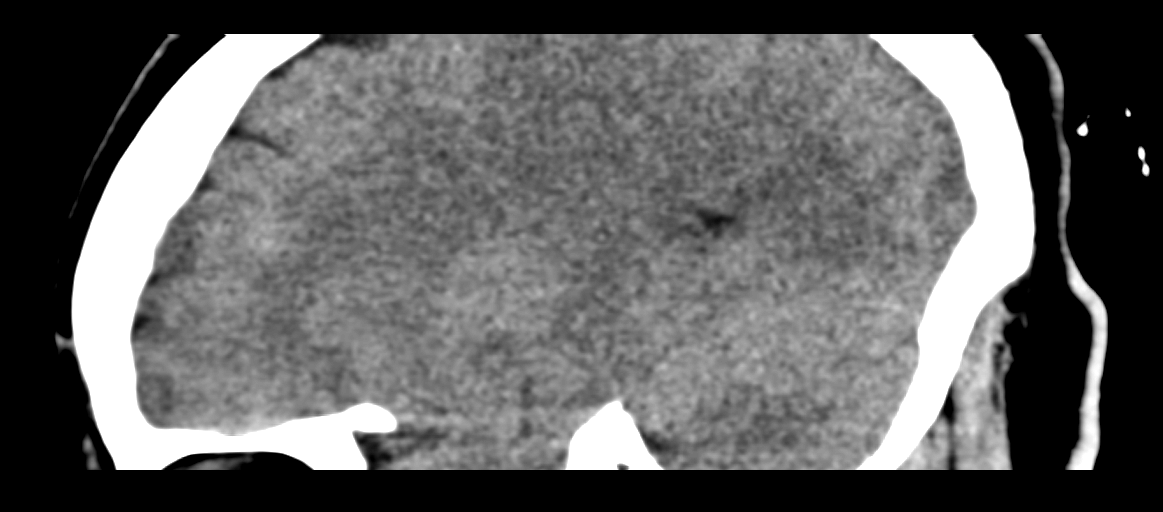
[im 35/53  brain]
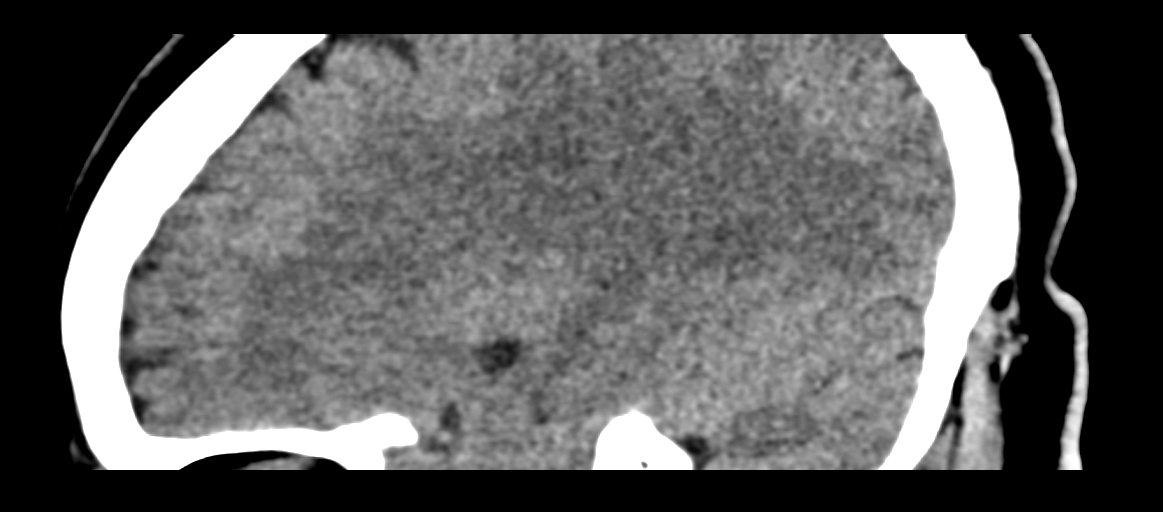

[14 of 47 positions shown; findings below may reference images not displayed]

FINDINGS: Brain: Study is somewhat degraded by patient motion. No acute
infarct, hemorrhage, or mass lesion is present. No significant white
matter lesions are present. The ventricles are of normal size. No
significant extraaxial fluid collection is present. The brainstem
and cerebellum are within normal limits.

Vascular: No hyperdense vessel or unexpected calcification.

Skull: Calvarium is intact. No focal lytic or blastic lesions are
present. No significant extracranial soft tissue lesion is present.

Sinuses/Orbits: The paranasal sinuses and mastoid air cells are
clear. Known left periorbital and nasal cellulitis does not extend
into the sinuses or intracranial
IMPRESSION: 1. Known left periorbital and nasal cellulitis does not extend into
the sinuses or intracranial.
2. Normal CT appearance of the brain.

## 2021-07-19 ENCOUNTER — Encounter (HOSPITAL_BASED_OUTPATIENT_CLINIC_OR_DEPARTMENT_OTHER): Payer: Self-pay

## 2021-07-19 ENCOUNTER — Emergency Department (HOSPITAL_BASED_OUTPATIENT_CLINIC_OR_DEPARTMENT_OTHER)
Admission: EM | Admit: 2021-07-19 | Discharge: 2021-07-19 | Disposition: A | Discharge status: Home / Self Care | Payer: BC Managed Care – PPO | Attending: Emergency Medicine | Admitting: Emergency Medicine

## 2021-07-19 ENCOUNTER — Other Ambulatory Visit: Payer: Self-pay

## 2021-07-19 DIAGNOSIS — E119 Type 2 diabetes mellitus without complications: Secondary | ICD-10-CM | POA: Insufficient documentation

## 2021-07-19 DIAGNOSIS — U071 COVID-19: Secondary | ICD-10-CM | POA: Diagnosis not present

## 2021-07-19 DIAGNOSIS — Z7984 Long term (current) use of oral hypoglycemic drugs: Secondary | ICD-10-CM | POA: Insufficient documentation

## 2021-07-19 DIAGNOSIS — Z79899 Other long term (current) drug therapy: Secondary | ICD-10-CM | POA: Diagnosis not present

## 2021-07-19 DIAGNOSIS — R059 Cough, unspecified: Secondary | ICD-10-CM | POA: Diagnosis present

## 2021-07-19 DIAGNOSIS — R001 Bradycardia, unspecified: Secondary | ICD-10-CM | POA: Insufficient documentation

## 2021-07-19 DIAGNOSIS — I10 Essential (primary) hypertension: Secondary | ICD-10-CM | POA: Insufficient documentation

## 2021-07-19 MED ORDER — PROMETHAZINE HCL 12.5 MG PO TABS: 12.5000 mg | ORAL_TABLET | Freq: Three times a day (TID) | ORAL | 0 refills | Status: AC | PRN

## 2021-07-19 MED ORDER — BENZONATATE 100 MG PO CAPS: 100.0000 mg | ORAL_CAPSULE | Freq: Three times a day (TID) | ORAL | 0 refills | Status: AC | PRN

## 2021-07-19 NOTE — ED Provider Notes (Signed)
[facility] EMERGENCY DEPARTMENT Provider Note   CSN: [id number] Arrival date & time: 07/19/21  1631     History Chief Complaint  Patient presents with   Covid Positive    [name] is a 50 y.o. female.  Patient diagnosed with COVID earlier this week.  Was concerned because her heart rate was in the 50s.  Oxygen levels have been normal.  She has had some body aches and cough but no severe respiratory symptoms.  The history is provided by the patient.  URI Presenting symptoms: congestion and cough   Presenting symptoms: no fever   Severity:  Mild Onset quality:  Gradual Duration:  5 days Timing:  Intermittent Progression:  Waxing and waning Relieved by:  Nothing Worsened by:  Nothing Associated symptoms: arthralgias and myalgias       Past Medical History:  Diagnosis Date   Diabetes mellitus without complication (HCC)    GERD (gastroesophageal reflux disease)    Hypertension    Ovarian cyst    Thyroid disease     There are no problems to display for this patient.   Past Surgical History:  Procedure Laterality Date   ABDOMINAL HYSTERECTOMY     BREAST SURGERY     CESAREAN SECTION     CHOLECYSTECTOMY     ESOPHAGEAL DILATION     KNEE SURGERY     THYROIDECTOMY       OB History     Gravida  2   Para  2   Term      Preterm      AB      Living         SAB      IAB      Ectopic      Multiple      Live Births              No family history on file.  Social History   Tobacco Use   Smoking status: Never   Smokeless tobacco: Never  Vaping Use   Vaping Use: Never used  Substance Use Topics   Alcohol use: Yes    Comment: occ   Drug use: No    Home Medications Prior to Admission medications   Medication Sig Start Date End Date Taking? Authorizing Provider  benzonatate (TESSALON) 100 MG capsule Take 1 capsule (100 mg total) by mouth 3 (three) times daily as needed for cough. 07/19/21  Yes Katlin Bortner, DO   amLODipine (NORVASC) 10 MG tablet Take 10 mg by mouth daily. 04/30/20   [provider]  diclofenac sodium (VOLTAREN) 1 % GEL Apply 4 g topically 4 (four) times daily. 06/27/19   Gwyneth Sprout, MD  hydrochlorothiazide (HYDRODIURIL) 25 MG tablet TK 1 T PO QD 06/15/19   [provider]  hydrOXYzine (ATARAX/VISTARIL) 25 MG tablet  06/15/19   [provider]  ibuprofen (ADVIL,MOTRIN) 600 MG tablet Take 1 tablet (600 mg total) by mouth every 6 (six) hours as needed. 03/12/18   Jacalyn Lefevre, MD  levothyroxine (SYNTHROID) 112 MCG tablet TK 2 TS PO QD 06/15/19   [provider]  lidocaine (LIDODERM) 5 % Place 1 patch onto the skin daily. Remove & Discard patch within 12 hours or as directed by MD 06/27/19   Gwyneth Sprout, MD  lisinopril (ZESTRIL) 10 MG tablet  06/20/19   [provider]  metFORMIN (GLUCOPHAGE) 1000 MG tablet  06/23/19   [provider]  naproxen (NAPROSYN) 500 MG tablet Take  1 tablet (500 mg total) by mouth 2 (two) times daily. 03/25/18   Petrucelli, Pleas Koch, PA-C  omeprazole (PRILOSEC) 40 MG capsule TK 1 C PO BID 06/15/19   [provider]  OZEMPIC, 0.25 OR 0.5 MG/DOSE, 2 MG/1.5ML SOPN INJ  0.25 MG DOSE  [place] ONCE A WK 06/23/19   [provider]  promethazine (PHENERGAN) 12.5 MG tablet Take 1 tablet (12.5 mg total) by mouth every 8 (eight) hours as needed for up to 10 doses for nausea or vomiting. 07/19/21   Esequiel Kleinfelter, DO  traZODone (DESYREL) 50 MG tablet Take 50 mg by mouth at bedtime. 02/24/20   [provider]  zolpidem (AMBIEN) 10 MG tablet  06/17/19   [provider]    Allergies    Metoclopramide, Ondansetron, Darvocet [propoxyphene n-acetaminophen], Hydrocodone-acetaminophen, and Zofran  Review of Systems   Review of Systems  Constitutional:  Positive for chills. Negative for fever.  HENT:  Positive for congestion.   Respiratory:  Positive for cough. Negative for shortness of breath.    Cardiovascular:  Negative for chest pain.  Musculoskeletal:  Positive for arthralgias and myalgias.   Physical Exam Updated Vital Signs BP 119/78 (BP Location: Right Arm)   Pulse (!) 51   Temp 98.2 F (36.8 C) (Oral)   Resp 18   Ht 5\' 2"  (1.575 m)   Wt 78.5 kg   SpO2 100%   BMI 31.64 kg/m   Physical Exam Constitutional:      General: She is not in acute distress.    Appearance: She is not ill-appearing.  HENT:     Nose: Nose normal.     Mouth/Throat:     Mouth: Mucous membranes are moist.  Eyes:     Pupils: Pupils are equal, round, and reactive to light.  Cardiovascular:     Rate and Rhythm: Bradycardia present.     Pulses: Normal pulses.  Pulmonary:     Effort: Pulmonary effort is normal.  Skin:    General: Skin is warm.     Capillary Refill: Capillary refill takes less than 2 seconds.  Neurological:     General: No focal deficit present.     Mental Status: She is alert.    ED Results / Procedures / Treatments   Labs (all labs ordered are listed, but only abnormal results are displayed) Labs Reviewed - No data to display  EKG EKG Interpretation  Date/Time:  Friday July 19 2021 16:42:11 EDT Ventricular Rate:  50 PR Interval:  202 QRS Duration: 78 QT Interval:  448 QTC Calculation: 408 R Axis:   21 Text Interpretation: Sinus bradycardia Confirmed by [id number] ([id number]) on 07/19/2021 4:46:16 PM  Radiology No results found.  Procedures Procedures   Medications Ordered in ED Medications - No data to display  ED Course  I have reviewed the triage vital signs and the nursing notes.  Pertinent labs & imaging results that were available during my care of the patient were reviewed by me and considered in my medical decision making (see chart for details).    MDM Rules/Calculators/A&P                           07/21/2021 is here for evaluation of low heart rate in the setting of COVID virus.  Patient diagnosed with COVID earlier this week.  She  was doing her pulse ox check and noticed that her heart rate was low in the 50s.  EKG here shows a sinus bradycardia.  No heart block.  Overall she appears well.  She is having some cough and body aches.  Will prescribe Phenergan and Tessalon Perles for symptomatic care.  Overall vital signs reassuring.  Ambulatory pulse ox is normal.  No signs of respiratory distress.  Understands return precautions and discharged in the ED in good condition.  This chart was dictated using voice recognition software.  Despite best efforts to proofread,  errors can occur which can change the documentation meaning.   Final Clinical Impression(s) / ED Diagnoses Final diagnoses:  COVID-19    Rx / DC Orders ED Discharge Orders          Ordered    benzonatate (TESSALON) 100 MG capsule  3 times daily PRN        07/19/21 1655    promethazine (PHENERGAN) 12.5 MG tablet  Every 8 hours PRN        07/19/21 1656             Virgina Norfolk, DO 07/19/21 1658

## 2021-07-19 NOTE — ED Triage Notes (Addendum)
Pt c/o low HR on O2 sat-states she was dx with covid 7/26-states she was advised by PCP to go to ED-pt [facility] ED PTA-pt NAD-to triage in w/c

## 2021-07-19 NOTE — Discharge Instructions (Addendum)
Recommend tea and honey for cough but if that does not work well I have provided you with a prescription for Tessalon Perles to help with cough.  Recommend continued use of Tylenol, ibuprofen.  I have also provided you with a prescription for Phenergan which you have used in the past for nausea and vomiting.

## 2023-06-29 ENCOUNTER — Emergency Department (HOSPITAL_BASED_OUTPATIENT_CLINIC_OR_DEPARTMENT_OTHER)
Admission: EM | Admit: 2023-06-29 | Discharge: 2023-06-29 | Disposition: A | Discharge status: Home / Self Care | Payer: BC Managed Care – PPO | Attending: Emergency Medicine | Admitting: Emergency Medicine

## 2023-06-29 ENCOUNTER — Emergency Department (HOSPITAL_BASED_OUTPATIENT_CLINIC_OR_DEPARTMENT_OTHER): Payer: BC Managed Care – PPO

## 2023-06-29 ENCOUNTER — Other Ambulatory Visit (HOSPITAL_BASED_OUTPATIENT_CLINIC_OR_DEPARTMENT_OTHER): Payer: Self-pay

## 2023-06-29 ENCOUNTER — Encounter (HOSPITAL_BASED_OUTPATIENT_CLINIC_OR_DEPARTMENT_OTHER): Payer: Self-pay

## 2023-06-29 DIAGNOSIS — E119 Type 2 diabetes mellitus without complications: Secondary | ICD-10-CM | POA: Diagnosis not present

## 2023-06-29 DIAGNOSIS — R1013 Epigastric pain: Secondary | ICD-10-CM | POA: Diagnosis present

## 2023-06-29 DIAGNOSIS — Z7984 Long term (current) use of oral hypoglycemic drugs: Secondary | ICD-10-CM | POA: Insufficient documentation

## 2023-06-29 DIAGNOSIS — K2900 Acute gastritis without bleeding: Secondary | ICD-10-CM | POA: Diagnosis not present

## 2023-06-29 DIAGNOSIS — I10 Essential (primary) hypertension: Secondary | ICD-10-CM | POA: Insufficient documentation

## 2023-06-29 DIAGNOSIS — Z79899 Other long term (current) drug therapy: Secondary | ICD-10-CM | POA: Insufficient documentation

## 2023-06-29 LAB — COMPREHENSIVE METABOLIC PANEL
ALT: 14 U/L (ref 0–44)
AST: 20 U/L (ref 15–41)
Albumin: 4 g/dL (ref 3.5–5.0)
Alkaline Phosphatase: 40 U/L (ref 38–126)
Anion gap: 8 (ref 5–15)
BUN: 13 mg/dL (ref 6–20)
CO2: 26 mmol/L (ref 22–32)
Calcium: 8.9 mg/dL (ref 8.9–10.3)
Chloride: 102 mmol/L (ref 98–111)
Creatinine, Ser: 1.11 mg/dL — ABNORMAL HIGH (ref 0.44–1.00)
GFR, Estimated: 60 mL/min — ABNORMAL LOW (ref >60)
Glucose, Bld: 95 mg/dL (ref 70–99)
Potassium: 4 mmol/L (ref 3.5–5.1)
Sodium: 136 mmol/L (ref 135–145)
Total Bilirubin: 0.7 mg/dL (ref 0.3–1.2)
Total Protein: 7 g/dL (ref 6.5–8.1)

## 2023-06-29 LAB — CBC
HCT: 38.2 % (ref 36.0–46.0)
Hemoglobin: 12.4 g/dL (ref 12.0–15.0)
MCH: 30.7 pg (ref 26.0–34.0)
MCHC: 32.5 g/dL (ref 30.0–36.0)
MCV: 94.6 fL (ref 80.0–100.0)
Platelets: 374 10*3/uL (ref 150–400)
RBC: 4.04 MIL/uL (ref 3.87–5.11)
RDW: 13.2 % (ref 11.5–15.5)
WBC: 7.3 10*3/uL (ref 4.0–10.5)
nRBC: 0 % (ref 0.0–0.2)

## 2023-06-29 LAB — CBG MONITORING, ED
Glucose-Capillary: 103 mg/dL — ABNORMAL HIGH (ref 70–99)
Glucose-Capillary: 107 mg/dL — ABNORMAL HIGH (ref 70–99)
Glucose-Capillary: 109 mg/dL — ABNORMAL HIGH (ref 70–99)
Glucose-Capillary: <10 mg/dL — CL (ref 70–99)
Glucose-Capillary: 74 mg/dL (ref 70–99)

## 2023-06-29 LAB — LIPASE, BLOOD: Lipase: 28 U/L (ref 11–51)

## 2023-06-29 MED ORDER — SODIUM CHLORIDE 0.9 % IV SOLN
12.5000 mg | Freq: Four times a day (QID) | INTRAVENOUS | Status: DC | PRN
  Administered 2023-06-29: 12.5 mg via INTRAVENOUS
  Filled 2023-06-29: qty 0.5

## 2023-06-29 MED ORDER — MORPHINE SULFATE (PF) 4 MG/ML IV SOLN
4.0000 mg | Freq: Once | INTRAVENOUS | Status: AC
  Administered 2023-06-29: 4 mg via INTRAVENOUS
  Filled 2023-06-29: qty 1

## 2023-06-29 MED ORDER — PROMETHAZINE HCL 25 MG/ML IJ SOLN
INTRAMUSCULAR | Status: AC
  Filled 2023-06-29: qty 1

## 2023-06-29 MED ORDER — IOHEXOL 300 MG/ML  SOLN
100.0000 mL | Freq: Once | INTRAMUSCULAR | Status: AC | PRN
  Administered 2023-06-29: 100 mL via INTRAVENOUS

## 2023-06-29 MED ORDER — PANTOPRAZOLE SODIUM 20 MG PO TBEC
20.0000 mg | DELAYED_RELEASE_TABLET | Freq: Every day | ORAL | 0 refills | Status: AC
  Filled 2023-06-29: qty 30, 30d supply, fill #0

## 2023-06-29 MED ORDER — SODIUM CHLORIDE 0.9 % IV SOLN: INTRAVENOUS | Status: DC | PRN

## 2023-06-29 MED ORDER — SUCRALFATE 1 G PO TABS
1.0000 g | ORAL_TABLET | Freq: Three times a day (TID) | ORAL | 0 refills | Status: AC
  Filled 2023-06-29: qty 42, 11d supply, fill #0

## 2023-06-29 MED ORDER — ALUM & MAG HYDROXIDE-SIMETH 200-200-20 MG/5ML PO SUSP
30.0000 mL | Freq: Once | ORAL | Status: AC
  Administered 2023-06-29: 30 mL via ORAL
  Filled 2023-06-29: qty 30

## 2023-06-29 MED ORDER — PANTOPRAZOLE SODIUM 40 MG IV SOLR
40.0000 mg | Freq: Once | INTRAVENOUS | Status: AC
  Administered 2023-06-29: 40 mg via INTRAVENOUS
  Filled 2023-06-29: qty 10

## 2023-06-29 NOTE — ED Notes (Signed)
Patient transported to CT 

## 2023-06-29 NOTE — ED Notes (Addendum)
CBG rechecked as patient is alert and oriented, reading 109. Invalid initial reading of <10

## 2023-06-29 NOTE — ED Notes (Signed)
CBG noted to be 109 via capillary CBG

## 2023-06-29 NOTE — ED Provider Notes (Signed)
[place] [facility] AT [facility] Provider Note   CSN: [id number] Arrival date & time: 06/29/23  2130     History  Chief Complaint  Patient presents with   Abdominal Pain    [name] is a 52 y.o. female.  Patient is a 52 year old female who presents with abdominal pain.  She has a history of diabetes, GERD, hypertension, thyroid disease.  She woke up this morning about 6 30-7 with intense pain in her upper abdomen.  She denies any similar symptoms in the past.  She had associated nausea and vomiting.  No change in stools.  No urinary symptoms.  She has had little bit of GERD symptoms over the last few days.  No known fevers.  She is status postcholecystectomy and hysterectomy.  She is on Ozempic.  She previously was on it for about a year and then stopped it and restarted it about 2 months ago.  She denies any prior issues with Ozempic.       Home Medications Prior to Admission medications   Medication Sig Start Date End Date Taking? Authorizing Provider  pantoprazole (PROTONIX) 20 MG tablet Take 1 tablet (20 mg total) by mouth daily. 06/29/23  Yes Rolan Bucco, MD  sucralfate (CARAFATE) 1 GM/10ML suspension Take 10 mLs (1 g total) by mouth 4 (four) times daily -  with meals and at bedtime. 06/29/23  Yes Rolan Bucco, MD  amLODipine (NORVASC) 10 MG tablet Take 10 mg by mouth daily. 04/30/20   [provider]  benzonatate (TESSALON) 100 MG capsule Take 1 capsule (100 mg total) by mouth 3 (three) times daily as needed for cough. 07/19/21   Curatolo, Adam, DO  diclofenac sodium (VOLTAREN) 1 % GEL Apply 4 g topically 4 (four) times daily. 06/27/19   Gwyneth Sprout, MD  hydrochlorothiazide (HYDRODIURIL) 25 MG tablet TK 1 T PO QD 06/15/19   [provider]  hydrOXYzine (ATARAX/VISTARIL) 25 MG tablet  06/15/19   [provider]  ibuprofen (ADVIL,MOTRIN) 600 MG tablet Take 1 tablet (600 mg total) by mouth every 6 (six) hours as needed.  03/12/18   Jacalyn Lefevre, MD  levothyroxine (SYNTHROID) 112 MCG tablet TK 2 TS PO QD 06/15/19   [provider]  lidocaine (LIDODERM) 5 % Place 1 patch onto the skin daily. Remove & Discard patch within 12 hours or as directed by MD 06/27/19   Gwyneth Sprout, MD  lisinopril (ZESTRIL) 10 MG tablet  06/20/19   [provider]  metFORMIN (GLUCOPHAGE) 1000 MG tablet  06/23/19   [provider]  naproxen (NAPROSYN) 500 MG tablet Take 1 tablet (500 mg total) by mouth 2 (two) times daily. 03/25/18   Petrucelli, Samantha R, PA-C  OZEMPIC, 0.25 OR 0.5 MG/DOSE, 2 MG/1.5ML SOPN INJ  0.25 MG DOSE  [place] ONCE A WK 06/23/19   [provider]  promethazine (PHENERGAN) 12.5 MG tablet Take 1 tablet (12.5 mg total) by mouth every 8 (eight) hours as needed for up to 10 doses for nausea or vomiting. 07/19/21   Curatolo, Adam, DO  traZODone (DESYREL) 50 MG tablet Take 50 mg by mouth at bedtime. 02/24/20   [provider]  zolpidem (AMBIEN) 10 MG tablet  06/17/19   [provider]      Allergies    Metoclopramide, Ondansetron, Darvocet [propoxyphene n-acetaminophen], Hydrocodone-acetaminophen, and Zofran    Review of Systems   Review of Systems  Constitutional:  Negative for chills, diaphoresis, fatigue and fever.  HENT:  Negative for congestion, rhinorrhea and sneezing.   Eyes: Negative.   Respiratory:  Negative for cough, chest tightness and shortness of breath.   Cardiovascular:  Negative for chest pain and leg swelling.  Gastrointestinal:  Positive for abdominal pain, nausea and vomiting. Negative for blood in stool and diarrhea.  Genitourinary:  Negative for difficulty urinating, flank pain, frequency and hematuria.  Musculoskeletal:  Negative for arthralgias and back pain.  Skin:  Negative for rash.  Neurological:  Negative for dizziness, speech difficulty, weakness, numbness and headaches.    Physical Exam Updated Vital Signs BP (!) 161/74   Pulse 62   Temp  97.8 F (36.6 C) (Oral)   Resp 13   Ht 5\' 2"  (1.575 m)   Wt 81.6 kg   SpO2 98%   BMI 32.92 kg/m  Physical Exam Constitutional:      Appearance: She is well-developed.  HENT:     Head: Normocephalic and atraumatic.  Eyes:     Pupils: Pupils are equal, round, and reactive to light.  Cardiovascular:     Rate and Rhythm: Normal rate and regular rhythm.     Heart sounds: Normal heart sounds.  Pulmonary:     Effort: Pulmonary effort is normal. No respiratory distress.     Breath sounds: Normal breath sounds. No wheezing or rales.  Chest:     Chest wall: No tenderness.  Abdominal:     General: Bowel sounds are normal.     Palpations: Abdomen is soft.     Tenderness: There is abdominal tenderness in the epigastric area. There is no guarding or rebound.  Musculoskeletal:        General: Normal range of motion.     Cervical back: Normal range of motion and neck supple.  Lymphadenopathy:     Cervical: No cervical adenopathy.  Skin:    General: Skin is warm and dry.     Findings: No rash.  Neurological:     Mental Status: She is alert and oriented to person, place, and time.     ED Results / Procedures / Treatments   Labs (all labs ordered are listed, but only abnormal results are displayed) Labs Reviewed  COMPREHENSIVE METABOLIC PANEL - Abnormal; Notable for the following components:      Result Value   Creatinine, Ser 1.11 (*)    GFR, Estimated 60 (*)    All other components within normal limits  CBG MONITORING, ED - Abnormal; Notable for the following components:   Glucose-Capillary <10 (*)    All other components within normal limits  CBG MONITORING, ED - Abnormal; Notable for the following components:   Glucose-Capillary 109 (*)    All other components within normal limits  CBG MONITORING, ED - Abnormal; Notable for the following components:   Glucose-Capillary 103 (*)    All other components within normal limits  CBG MONITORING, ED - Abnormal; Notable for the  following components:   Glucose-Capillary 107 (*)    All other components within normal limits  LIPASE, BLOOD  CBC  URINALYSIS, ROUTINE W REFLEX MICROSCOPIC  CBG MONITORING, ED    EKG EKG Interpretation Date/Time:  Monday June 29 2023 07:43:15 EDT Ventricular Rate:  82 PR Interval:  194 QRS Duration:  80 QT Interval:  371 QTC Calculation: 434 R Axis:   30  Text Interpretation: Sinus rhythm since last tracing no significant change Confirmed by Rolan Bucco [id number]) on 06/29/2023 9:04:39 AM  Radiology CT ABDOMEN PELVIS W CONTRAST  Result Date: 06/29/2023 CLINICAL  DATA:  Nausea, vomiting, epigastric pain. EXAM: CT ABDOMEN AND PELVIS WITH CONTRAST TECHNIQUE: Multidetector CT imaging of the abdomen and pelvis was performed using the standard protocol following bolus administration of intravenous contrast. RADIATION DOSE REDUCTION: This exam was performed according to the departmental dose-optimization program which includes automated exposure control, adjustment of the mA and/or kV according to patient size and/or use of iterative reconstruction technique. CONTRAST:  OMNIPAQUE IOHEXOL 300 MG/ML  SOLN COMPARISON:  CT abdomen/pelvis 12/05/2018 FINDINGS: Lower chest: The lung bases are clear. The imaged heart is unremarkable. Hepatobiliary: The liver is unremarkable. The gallbladder is surgically absent, likely accounting for the mild intra and extrahepatic biliary ductal prominence. Pancreas: Unremarkable. Spleen: Unremarkable. Adrenals/Urinary Tract: The adrenals are unremarkable. A left upper pole renal cyst is unchanged requiring no specific imaging follow-up. There are no suspicious renal lesions. There are no stones in either kidney or along the course of either ureter. There is symmetric excretion of contrast into the collecting systems on the delayed images. The bladder is unremarkable. Stomach/Bowel: The stomach is unremarkable. There is no evidence of bowel obstruction. There is no  abnormal bowel wall thickening or inflammatory change. The appendix is normal. Vascular/Lymphatic: The abdominal aorta is normal in course and caliber. The major branch vessels are patent. The main portal and splenic veins are patent. There is no abdominopelvic lymphadenopathy. Reproductive: The uterus is surgically absent. There is a 1.9 cm left adnexal cyst requiring no specific follow-up. There is no suspicious adnexal mass. Other: There is no ascites or free air. Musculoskeletal: There is no acute osseous abnormality or suspicious osseous lesion. IMPRESSION: No acute findings in the abdomen or pelvis. Electronically Signed   By: Lesia Hausen M.D.   On: 06/29/2023 10:13    Procedures Procedures    Medications Ordered in ED Medications  promethazine (PHENERGAN) 12.5 mg in sodium chloride 0.9 % 50 mL IVPB (0 mg Intravenous Stopped 06/29/23 0912)  promethazine (PHENERGAN) 25 MG/ML injection (  Not Given 06/29/23 0858)  0.9 %  sodium chloride infusion (0 mLs Intravenous Stopped 06/29/23 0912)  morphine (PF) 4 MG/ML injection 4 mg (4 mg Intravenous Given 06/29/23 0847)  pantoprazole (PROTONIX) injection 40 mg (40 mg Intravenous Given 06/29/23 0846)  iohexol (OMNIPAQUE) 300 MG/ML solution 100 mL (100 mLs Intravenous Contrast Given 06/29/23 0915)  morphine (PF) 4 MG/ML injection 4 mg (4 mg Intravenous Given 06/29/23 1014)  alum & mag hydroxide-simeth (MAALOX/MYLANTA) 200-200-20 MG/5ML suspension 30 mL (30 mLs Oral Given 06/29/23 1042)    ED Course/ Medical Decision Making/ A&P                             Medical Decision Making Amount and/or Complexity of Data Reviewed Labs: ordered. Radiology: ordered.  Risk OTC drugs. Prescription drug management.   Patient is a 52 year old female who presents with upper abdominal pain.  She is status postcholecystectomy.  She has no hematemesis.  Labs are reviewed.  There is no evidence of pancreatitis.  Her LFTs are within normal limits.  She does not have any  urinary symptoms.  She had a CT scan of her abdomen pelvis which showed no acute abnormality.  No evidence of pancreatitis.  No evidence of colitis.  No obstruction.  She was given Protonix as well as some pain medication antiemetics.  She had some ongoing symptoms that resolved with a GI cocktail.  I suspect she has gastritis.  She was discharged home in good  condition.  She is able to tolerate oral fluids and feels much better.  She was given a prescription for Protonix and Carafate.  Of note, her blood glucose initially read less than 10 but this apparently was an inaccurate reading.  It was rechecked and was 109.  However she does say that her glucose was running a bit low this morning.  This was likely from her vomiting and not having p.o. intake.  However her recent Ozempic dose that she restarted with was higher than she previously was using.  I advised her to hold her Ozempic today and contact her primary care doctor today or tomorrow regarding restarting it and at what dose.  Return precautions were given.  Final Clinical Impression(s) / ED Diagnoses Final diagnoses:  Epigastric pain  Acute gastritis without hemorrhage, unspecified gastritis type    Rx / DC Orders ED Discharge Orders          Ordered    pantoprazole (PROTONIX) 20 MG tablet  Daily        06/29/23 1239    sucralfate (CARAFATE) 1 GM/10ML suspension  3 times daily with meals & bedtime        06/29/23 1239              Rolan Bucco, MD 06/29/23 1250

## 2023-06-29 NOTE — Discharge Instructions (Addendum)
Monitor your blood sugars frequently.  Do not take your Ozempic today and talk to her doctor today or tomorrow regarding what dose to continue it at.  Follow-up with your primary care doctor within the next week for recheck.  Return to the emergency room if you have any worsening symptoms.

## 2023-06-29 NOTE — ED Triage Notes (Addendum)
States this morning her CBG dropped to 56, has had nausea, vomiting, epigastric pain.   Recently had dosage change to ozempic 2 weeks ago.  CBG reading LO on glucometer at arrival, given OJ, Peanut butter and crackers

## 2024-01-01 ENCOUNTER — Other Ambulatory Visit: Payer: Self-pay

## 2024-01-01 ENCOUNTER — Encounter (HOSPITAL_BASED_OUTPATIENT_CLINIC_OR_DEPARTMENT_OTHER): Payer: Self-pay | Admitting: Emergency Medicine

## 2024-01-01 ENCOUNTER — Emergency Department (HOSPITAL_BASED_OUTPATIENT_CLINIC_OR_DEPARTMENT_OTHER)
Admission: EM | Admit: 2024-01-01 | Discharge: 2024-01-01 | Disposition: A | Discharge status: Home / Self Care | Payer: 59 | Attending: Emergency Medicine | Admitting: Emergency Medicine

## 2024-01-01 DIAGNOSIS — E039 Hypothyroidism, unspecified: Secondary | ICD-10-CM | POA: Insufficient documentation

## 2024-01-01 DIAGNOSIS — F410 Panic disorder [episodic paroxysmal anxiety] without agoraphobia: Secondary | ICD-10-CM | POA: Insufficient documentation

## 2024-01-01 LAB — CBC WITH DIFFERENTIAL/PLATELET
Abs Immature Granulocytes: 0.02 10*3/uL (ref 0.00–0.07)
Basophils Absolute: 0.1 10*3/uL (ref 0.0–0.1)
Basophils Relative: 1 %
Eosinophils Absolute: 0 10*3/uL (ref 0.0–0.5)
Eosinophils Relative: 1 %
HCT: 38.5 % (ref 36.0–46.0)
Hemoglobin: 12.6 g/dL (ref 12.0–15.0)
Immature Granulocytes: 0 %
Lymphocytes Relative: 27 %
Lymphs Abs: 2.3 10*3/uL (ref 0.7–4.0)
MCH: 30 pg (ref 26.0–34.0)
MCHC: 32.7 g/dL (ref 30.0–36.0)
MCV: 91.7 fL (ref 80.0–100.0)
Monocytes Absolute: 0.5 10*3/uL (ref 0.1–1.0)
Monocytes Relative: 6 %
Neutro Abs: 5.6 10*3/uL (ref 1.7–7.7)
Neutrophils Relative %: 65 %
Platelets: 330 10*3/uL (ref 150–400)
RBC: 4.2 MIL/uL (ref 3.87–5.11)
RDW: 13.2 % (ref 11.5–15.5)
WBC: 8.6 10*3/uL (ref 4.0–10.5)
nRBC: 0 % (ref 0.0–0.2)

## 2024-01-01 LAB — TROPONIN I (HIGH SENSITIVITY): Troponin I (High Sensitivity): 2 ng/L (ref <18)

## 2024-01-01 LAB — BASIC METABOLIC PANEL
Anion gap: 11 (ref 5–15)
BUN: 22 mg/dL — ABNORMAL HIGH (ref 6–20)
CO2: 22 mmol/L (ref 22–32)
Calcium: 9.2 mg/dL (ref 8.9–10.3)
Chloride: 104 mmol/L (ref 98–111)
Creatinine, Ser: 1.23 mg/dL — ABNORMAL HIGH (ref 0.44–1.00)
GFR, Estimated: 53 mL/min — ABNORMAL LOW (ref >60)
Glucose, Bld: 105 mg/dL — ABNORMAL HIGH (ref 70–99)
Potassium: 3.2 mmol/L — ABNORMAL LOW (ref 3.5–5.1)
Sodium: 137 mmol/L (ref 135–145)

## 2024-01-01 MED ORDER — DIAZEPAM 5 MG PO TABS: 5.0000 mg | ORAL_TABLET | Freq: Two times a day (BID) | ORAL | 0 refills | Status: AC

## 2024-01-01 MED ORDER — DIAZEPAM 5 MG/ML IJ SOLN
5.0000 mg | Freq: Once | INTRAMUSCULAR | Status: AC
  Administered 2024-01-01: 5 mg via INTRAVENOUS
  Filled 2024-01-01: qty 2

## 2024-01-01 NOTE — ED Provider Notes (Signed)
 [place] [facility] AT [facility] Provider Note   CSN: [id number] Arrival date & time: 01/01/24  1850     History Chief Complaint  Patient presents with   Panic Attack    HPI [name] is a 53 y.o. female presenting for hot flashes and panic attack.  53 year old female history of hypothyroidism recently increased Synthroid dose.  States she is been having feelings of palpitations, daily panic attacks and hot flashes.  States that she is recently also started menopause and states that this has exacerbated her symptoms.  Denies fevers chills nausea vomit syncope shortness of breath.  Otherwise ambulatory tolerating p.o. intake.   Patient's recorded medical, surgical, social, medication list and allergies were reviewed in the Snapshot window as part of the initial history.   Review of Systems   Review of Systems  Constitutional:  Positive for fatigue. Negative for chills and fever.  HENT:  Negative for ear pain and sore throat.   Eyes:  Negative for pain and visual disturbance.  Respiratory:  Negative for cough and shortness of breath.   Cardiovascular:  Positive for palpitations. Negative for chest pain.  Gastrointestinal:  Negative for abdominal pain and vomiting.  Genitourinary:  Negative for dysuria and hematuria.  Musculoskeletal:  Negative for arthralgias and back pain.  Skin:  Negative for color change and rash.  Neurological:  Negative for seizures and syncope.  All other systems reviewed and are negative.   Physical Exam Updated Vital Signs BP (!) 148/96   Pulse 63   Temp (!) 97.5 F (36.4 C) (Oral)   Resp 17   Wt 85 kg   SpO2 100%   BMI 34.27 kg/m  Physical Exam Vitals and nursing note reviewed.  Constitutional:      General: She is not in acute distress.    Appearance: She is well-developed.  HENT:     Head: Normocephalic and atraumatic.  Eyes:     Conjunctiva/sclera: Conjunctivae normal.  Cardiovascular:     Rate and  Rhythm: Normal rate and regular rhythm.     Heart sounds: No murmur heard. Pulmonary:     Effort: Pulmonary effort is normal. No respiratory distress.     Breath sounds: Normal breath sounds.  Abdominal:     Palpations: Abdomen is soft.     Tenderness: There is no abdominal tenderness.  Musculoskeletal:        General: No swelling.     Cervical back: Neck supple.  Skin:    General: Skin is warm and dry.     Capillary Refill: Capillary refill takes less than 2 seconds.  Neurological:     Mental Status: She is alert.  Psychiatric:        Mood and Affect: Mood normal.      ED Course/ Medical Decision Making/ A&P    Procedures Procedures   Medications Ordered in ED Medications  diazepam  (VALIUM ) injection 5 mg (5 mg Intravenous Given 01/01/24 1926)    Medical Decision Making:   53 year old female with a myriad of complaints detailed above. She is in no acute distress. History of present illness and physical Sam findings are most consistent with either acute panic disorder versus hyperthyroidism secondary to her medication changes. Considered thyrotoxicosis, however the would be grossly atypical just secondary to supplementation and currently her heart rate and lack of GI symptoms are less consistent therefore panic disorder favored heavily. Will evaluate with CBC, BMP for acute metabolic emergency, treat with Valium , EKG and troponin due  to some chest pain earlier in the evening now resolved.  Will plan for serial assessments of patient's status.  Reassessment: On reassessment, patient is resting comfortably no acute distress stable for outpatient care and management .  Disposition:  I have considered need for hospitalization, however, considering all of the above, I believe this patient is stable for discharge at this time.  Patient/family educated about specific return precautions for given chief complaint and symptoms.  Patient/family educated about follow-up with PCP .      Patient/family expressed understanding of return precautions and need for follow-up. Patient spoken to regarding all imaging and laboratory results and appropriate follow up for these results. All education provided in verbal form with additional information in written form. Time was allowed for answering of patient questions. Patient discharged.    Emergency Department Medication Summary:   Medications  diazepam  (VALIUM ) injection 5 mg (5 mg Intravenous Given 01/01/24 1926)        Clinical Impression:  1. Panic disorder      Discharge   Final Clinical Impression(s) / ED Diagnoses Final diagnoses:  Panic disorder    Rx / DC Orders ED Discharge Orders          Ordered    diazepam  (VALIUM ) 5 MG tablet  2 times daily        01/01/24 2015              Jerral Meth, MD 01/01/24 2016

## 2024-01-01 NOTE — ED Triage Notes (Signed)
 Patient BIB [facility] c/o panic attack x 2 hours.  Patient reports hot flashes and feeling anxious.  Patient reports history of same but is not taking anything for anxiety.  Patient also reports she has had her thyroid  removed, is taking medication for it, but her thyroid  levels have still been low. Patient visibly anxious during triage.

## 2024-01-03 ENCOUNTER — Encounter (HOSPITAL_COMMUNITY): Payer: Self-pay

## 2024-01-03 ENCOUNTER — Emergency Department (HOSPITAL_COMMUNITY): Payer: 59

## 2024-01-03 ENCOUNTER — Emergency Department (HOSPITAL_COMMUNITY)
Admission: EM | Admit: 2024-01-03 | Discharge: 2024-01-03 | Disposition: A | Discharge status: Home / Self Care | Payer: 59 | Attending: Emergency Medicine | Admitting: Emergency Medicine

## 2024-01-03 ENCOUNTER — Other Ambulatory Visit: Payer: Self-pay

## 2024-01-03 DIAGNOSIS — E119 Type 2 diabetes mellitus without complications: Secondary | ICD-10-CM | POA: Diagnosis not present

## 2024-01-03 DIAGNOSIS — R002 Palpitations: Secondary | ICD-10-CM | POA: Insufficient documentation

## 2024-01-03 DIAGNOSIS — R202 Paresthesia of skin: Secondary | ICD-10-CM | POA: Insufficient documentation

## 2024-01-03 DIAGNOSIS — R0789 Other chest pain: Secondary | ICD-10-CM | POA: Diagnosis present

## 2024-01-03 DIAGNOSIS — Z79899 Other long term (current) drug therapy: Secondary | ICD-10-CM | POA: Diagnosis not present

## 2024-01-03 DIAGNOSIS — N951 Menopausal and female climacteric states: Secondary | ICD-10-CM | POA: Diagnosis not present

## 2024-01-03 DIAGNOSIS — R079 Chest pain, unspecified: Secondary | ICD-10-CM

## 2024-01-03 DIAGNOSIS — I10 Essential (primary) hypertension: Secondary | ICD-10-CM | POA: Insufficient documentation

## 2024-01-03 LAB — TROPONIN I (HIGH SENSITIVITY): Troponin I (High Sensitivity): <2 ng/L (ref <18)

## 2024-01-03 LAB — BASIC METABOLIC PANEL
Anion gap: 5 (ref 5–15)
BUN: 15 mg/dL (ref 6–20)
CO2: 26 mmol/L (ref 22–32)
Calcium: 9.6 mg/dL (ref 8.9–10.3)
Chloride: 104 mmol/L (ref 98–111)
Creatinine, Ser: 0.99 mg/dL (ref 0.44–1.00)
GFR, Estimated: >60 mL/min (ref >60)
Glucose, Bld: 97 mg/dL (ref 70–99)
Potassium: 3.6 mmol/L (ref 3.5–5.1)
Sodium: 135 mmol/L (ref 135–145)

## 2024-01-03 LAB — CBC
HCT: 40 % (ref 36.0–46.0)
Hemoglobin: 13.1 g/dL (ref 12.0–15.0)
MCH: 30.9 pg (ref 26.0–34.0)
MCHC: 32.8 g/dL (ref 30.0–36.0)
MCV: 94.3 fL (ref 80.0–100.0)
Platelets: 310 10*3/uL (ref 150–400)
RBC: 4.24 MIL/uL (ref 3.87–5.11)
RDW: 13.3 % (ref 11.5–15.5)
WBC: 6.6 10*3/uL (ref 4.0–10.5)
nRBC: 0 % (ref 0.0–0.2)

## 2024-01-03 LAB — TSH: TSH: 2.016 u[IU]/mL (ref 0.350–4.500)

## 2024-01-03 LAB — T4, FREE: Free T4: 1.41 ng/dL — ABNORMAL HIGH (ref 0.61–1.12)

## 2024-01-03 MED ORDER — DIPHENHYDRAMINE HCL 50 MG/ML IJ SOLN
25.0000 mg | Freq: Once | INTRAMUSCULAR | Status: AC
  Administered 2024-01-03: 25 mg via INTRAVENOUS
  Filled 2024-01-03: qty 1

## 2024-01-03 MED ORDER — KETOROLAC TROMETHAMINE 15 MG/ML IJ SOLN
15.0000 mg | Freq: Once | INTRAMUSCULAR | Status: AC
  Administered 2024-01-03: 15 mg via INTRAVENOUS
  Filled 2024-01-03: qty 1

## 2024-01-03 MED ORDER — PROCHLORPERAZINE EDISYLATE 10 MG/2ML IJ SOLN
10.0000 mg | Freq: Once | INTRAMUSCULAR | Status: AC
  Administered 2024-01-03: 10 mg via INTRAVENOUS
  Filled 2024-01-03: qty 2

## 2024-01-03 MED ORDER — SODIUM CHLORIDE 0.9 % IV BOLUS
1000.0000 mL | Freq: Once | INTRAVENOUS | Status: AC
  Administered 2024-01-03: 1000 mL via INTRAVENOUS

## 2024-01-03 MED ORDER — IOHEXOL 350 MG/ML SOLN
100.0000 mL | Freq: Once | INTRAVENOUS | Status: AC | PRN
  Administered 2024-01-03: 100 mL via INTRAVENOUS

## 2024-01-03 NOTE — ED Notes (Signed)
 Save blue tube in main lab

## 2024-01-03 NOTE — ED Provider Notes (Signed)
 [place] EMERGENCY DEPARTMENT AT [facility] Provider Note   CSN: [id number] Arrival date & time: 01/03/24  1211     History  Chief Complaint  Patient presents with   Chest Pain    [name] is a 53 y.o. female, history of diabetes, hypertension, who presents to the ED secondary to chest pain that is been going on for the last week.  She states that she was having it for the last week, is concerned that her thyroid  is off, as she has been having problems with her thyroid  supplementation, her thyroid  has been going up and down she states, and she is concerned that it is overactive now.  She also reports some palpitations, and having hot flashes.  She is currently going through menopause.  Denies any shortness of breath.  Chest pain is pressurized, as well as sharp and stabbing, in the middle of her chest, with some left arm tingling.  She also endorses a headache.  No loss of vision, but feels like her speech is a little bit slower slower than usual.  Is having a hard time concentrating.  She states she is currently going through menopause, and does not know if that is related to that.     Home Medications Prior to Admission medications   Medication Sig Start Date End Date Taking? Authorizing Provider  amLODipine (NORVASC) 10 MG tablet Take 10 mg by mouth daily. 04/30/20   [provider]  benzonatate  (TESSALON ) 100 MG capsule Take 1 capsule (100 mg total) by mouth 3 (three) times daily as needed for cough. 07/19/21   Curatolo, Adam, DO  carvedilol (COREG) 3.125 MG tablet Take 3.125 mg by mouth 2 (two) times daily. 10/07/23   [provider]  clotrimazole-betamethasone (LOTRISONE) cream Apply 1 Application topically 2 (two) times daily. 08/26/23   [provider]  diazepam  (VALIUM ) 5 MG tablet Take 1 tablet (5 mg total) by mouth 2 (two) times daily. 01/01/24   Jerral Meth, MD  diclofenac  sodium (VOLTAREN ) 1 % GEL Apply 4 g topically 4 (four)  times daily. 06/27/19   Doretha Folks, MD  esomeprazole (NEXIUM) 40 MG capsule Take 40 mg by mouth daily. 09/28/23   [provider]  hydrochlorothiazide (HYDRODIURIL) 25 MG tablet TK 1 T PO QD 06/15/19   [provider]  hydrOXYzine (ATARAX/VISTARIL) 25 MG tablet  06/15/19   [provider]  ibuprofen  (ADVIL ,MOTRIN ) 600 MG tablet Take 1 tablet (600 mg total) by mouth every 6 (six) hours as needed. 03/12/18   Haviland, Julie, MD  levothyroxine (SYNTHROID) 112 MCG tablet TK 2 TS PO QD 06/15/19   [provider]  lidocaine  (LIDODERM ) 5 % Place 1 patch onto the skin daily. Remove & Discard patch within 12 hours or as directed by MD 06/27/19   Doretha Folks, MD  lisinopril (ZESTRIL) 10 MG tablet  06/20/19   [provider]  metFORMIN (GLUCOPHAGE) 1000 MG tablet  06/23/19   [provider]  naproxen  (NAPROSYN ) 500 MG tablet Take 1 tablet (500 mg total) by mouth 2 (two) times daily. 03/25/18   Petrucelli, Samantha R, PA-C  OZEMPIC, 0.25 OR 0.5 MG/DOSE, 2 MG/1.5ML SOPN INJ  0.25 MG DOSE   ONCE A WK 06/23/19   [provider]  pantoprazole  (PROTONIX ) 20 MG tablet Take 1 tablet (20 mg total) by mouth daily. 06/29/23   Lenor Hollering, MD  promethazine  (PHENERGAN ) 12.5 MG tablet Take 1 tablet (12.5 mg total) by mouth every 8 (eight)  hours as needed for up to 10 doses for nausea or vomiting. 07/19/21   Curatolo, Adam, DO  sucralfate  (CARAFATE ) 1 g tablet Take 1 tablet (1 g total) by mouth 4 (four) times daily -  with meals and at bedtime. 06/29/23   Lenor Hollering, MD  traZODone (DESYREL) 50 MG tablet Take 50 mg by mouth at bedtime. 02/24/20   [provider]  Vitamin D, Ergocalciferol, (DRISDOL) 1.25 MG (50000 UNIT) CAPS capsule Take 50,000 Units by mouth 2 (two) times a week. 07/31/23   [provider]  zolpidem (AMBIEN) 10 MG tablet  06/17/19   [provider]      Allergies    Metoclopramide , Ondansetron , Darvocet [propoxyphene  n-acetaminophen ], Hydrocodone -acetaminophen , and Zofran     Review of Systems   Review of Systems  Respiratory:  Negative for shortness of breath.   Cardiovascular:  Positive for chest pain.    Physical Exam Updated Vital Signs BP (!) 164/99 (BP Location: Left Arm)   Pulse 73   Temp 97.8 F (36.6 C) (Oral)   Resp 16   SpO2 100%  Physical Exam Vitals and nursing note reviewed.  Constitutional:      General: She is not in acute distress.    Appearance: She is well-developed.  HENT:     Head: Normocephalic and atraumatic.  Eyes:     Conjunctiva/sclera: Conjunctivae normal.  Cardiovascular:     Rate and Rhythm: Normal rate and regular rhythm.     Heart sounds: No murmur heard. Pulmonary:     Effort: Pulmonary effort is normal. No respiratory distress.     Breath sounds: Normal breath sounds.  Abdominal:     Palpations: Abdomen is soft.     Tenderness: There is no abdominal tenderness.  Musculoskeletal:        General: No swelling.     Cervical back: Neck supple.  Skin:    General: Skin is warm and dry.     Capillary Refill: Capillary refill takes less than 2 seconds.  Neurological:     Mental Status: She is alert.  Psychiatric:        Mood and Affect: Mood normal.     ED Results / Procedures / Treatments   Labs (all labs ordered are listed, but only abnormal results are displayed) Labs Reviewed  BASIC METABOLIC PANEL  CBC  TSH  T4, FREE  TROPONIN I (HIGH SENSITIVITY)  TROPONIN I (HIGH SENSITIVITY)    EKG EKG Interpretation Date/Time:  Sunday January 03 2024 12:19:55 EST Ventricular Rate:  89 PR Interval:  163 QRS Duration:  85 QT Interval:  355 QTC Calculation: 432 R Axis:   25  Text Interpretation: Sinus rhythm Abnormal R-wave progression, early transition No acute changes No significant change since last tracing Confirmed by Charlyn Sora [phone]) on 01/03/2024 1:00:30 PM  Radiology CT Angio Chest/Abd/Pel for Dissection W and/or Wo Contrast Result  Date: 01/03/2024 CLINICAL DATA:  Stabbing chest pain for several days. Suspected acute aortic syndrome. EXAM: CT ANGIOGRAPHY CHEST, ABDOMEN AND PELVIS TECHNIQUE: Non-contrast CT of the chest was initially obtained. Multidetector CT imaging through the chest, abdomen and pelvis was performed using the standard protocol during bolus administration of intravenous contrast. Multiplanar reconstructed images and MIPs were obtained and reviewed to evaluate the vascular anatomy. RADIATION DOSE REDUCTION: This exam was performed according to the departmental dose-optimization program which includes automated exposure control, adjustment of the mA and/or kV according to patient size and/or use of iterative reconstruction technique. CONTRAST:  OMNIPAQUE   IOHEXOL  350 MG/ML SOLN COMPARISON:  AP CT on 06/29/2023, and chest CTA on 09/23/2013 FINDINGS: CTA CHEST FINDINGS Cardiovascular: No evidence of thoracic aortic dissection, mural hemorrhage, or aneurysm. satisfactory opacification of pulmonary arteries noted, and no pulmonary emboli identified. Mediastinum/Nodes: No masses or pathologically enlarged lymph nodes identified. Lungs/Pleura: No pulmonary mass, infiltrate, or effusion. Musculoskeletal: No suspicious bone lesions identified. Review of the MIP images confirms the above findings. CTA ABDOMEN AND PELVIS FINDINGS VASCULAR Aorta: Normal caliber aorta without aneurysm, dissection, vasculitis or significant stenosis. Celiac: Patent without evidence of aneurysm, dissection, vasculitis or significant stenosis. SMA: Patent without evidence of aneurysm, dissection, vasculitis or significant stenosis. Renals: Both renal arteries are patent without evidence of aneurysm, dissection, vasculitis, fibromuscular dysplasia or significant stenosis. IMA: Patent without evidence of aneurysm, dissection, vasculitis or significant stenosis. Inflow: Patent without evidence of aneurysm, dissection, vasculitis or significant stenosis.  Veins: No obvious venous abnormality within the limitations of this arterial phase study. Review of the MIP images confirms the above findings. NON-VASCULAR Hepatobiliary: No suspicious hepatic masses identified. Prior cholecystectomy. No evidence of biliary obstruction. Pancreas:  No mass or inflammatory changes. Spleen: Within normal limits in size and appearance. Adrenals/Urinary Tract: No suspicious masses identified. No evidence of ureteral calculi or hydronephrosis. Stomach/Bowel: No evidence of obstruction, inflammatory process or abnormal fluid collections. Normal appendix visualized. Vascular/Lymphatic: No pathologically enlarged lymph nodes. No acute vascular findings. Reproductive: Prior hysterectomy noted. Adnexal regions are unremarkable in appearance. Other:  None. Musculoskeletal:  No suspicious bone lesions identified. Review of the MIP images confirms the above findings. IMPRESSION: No acute findings. No signs of acute aortic syndrome or other significant abnormality. Electronically Signed   By: Norleen DELENA Kil M.D.   On: 01/03/2024 17:14   DG Chest 2 View Result Date: 01/03/2024 CLINICAL DATA:  Chest pain since Friday. EXAM: CHEST - 2 VIEW COMPARISON:  01/12/2017 FINDINGS: The lungs are clear without focal pneumonia, edema, pneumothorax or pleural effusion. The cardiopericardial silhouette is within normal limits for size. No acute bony abnormality. Telemetry leads overlie the chest. IMPRESSION: No active cardiopulmonary disease. Electronically Signed   By: Camellia Candle M.D.   On: 01/03/2024 13:33    Procedures Procedures    Medications Ordered in ED Medications  sodium chloride  0.9 % bolus 1,000 mL (0 mLs Intravenous Stopped 01/03/24 1630)  prochlorperazine  (COMPAZINE ) injection 10 mg (10 mg Intravenous Given 01/03/24 1352)  diphenhydrAMINE  (BENADRYL ) injection 25 mg (25 mg Intravenous Given 01/03/24 1352)  ketorolac  (TORADOL ) 15 MG/ML injection 15 mg (15 mg Intravenous Given 01/03/24  1352)  iohexol  (OMNIPAQUE ) 350 MG/ML injection 100 mL (100 mLs Intravenous Contrast Given 01/03/24 1646)    ED Course/ Medical Decision Making/ A&P                                 Medical Decision Making Patient is a 53 year old female, here for chest pain, with left arm numbness, this been going on for the last week.  She feels like her symptoms are worsening, and feels like she was not taken seriously have her last ER visit.  She would like further evaluation.  Given the chest pain, with possible radiation to the back, and neurosymptoms, we will obtain a CTA dissection study, we will also check her TSH and T4, she is concerned that these may be out of whack, as she has had some issues, with her thyroid  medication.  Will also obtain troponin, for further evaluation.  Complaining of slight headache, but no neurodeficits on my exam, just perceived, thus we will give headache cocktail to see if any relief  Amount and/or Complexity of Data Reviewed Labs: ordered.    Details: On and within normal limits, blood work reassuring, normal TSH Radiology: ordered.    Details: CTA chest/abdomen/pelvis within normal limits Discussion of management or test interpretation with external provider(s): Discussed with patient, her CTA is reassuring, her TSH is within normal limits, T4 is not back yet, but I instructed her to follow-up with the PCP in regards to this.  I with that her symptoms are likely related to the vasomotor symptoms that she has from menopause, which is causing the palpitations, increased anxiety, diaphoresis, and difficulty concentrating.  I informed her to follow-up with her OB/GYN, and her PCP, for further evaluation.  She is well-appearing on my exam, and was discharged home  Risk Prescription drug management.    Final Clinical Impression(s) / ED Diagnoses Final diagnoses:  Chest pain, unspecified type  Vasomotor symptoms due to menopause  Palpitations    Rx / DC Orders ED  Discharge Orders     None         Philippa Lyle CROME, PA 01/03/24 1731    Garrick Charleston, MD 01/03/24 [phone]

## 2024-01-03 NOTE — ED Triage Notes (Signed)
 Pt reports with a intermittent stabbing pain in chest since Friday. Pt states that she was seen prior and treated for anxiety. Pt is crying in triage. Pt states that the pain radiates down her left arm.

## 2024-01-03 NOTE — Discharge Instructions (Signed)
 Your blood work today was reassuring, your T4 is still pending at this time.  You should follow-up with your PCP in regards to this.  I believe that a lot of your symptoms are from menopause, or the vasomotor symptoms.  Please follow-up with your gynecologist and your PCP, for further management.  Your CT was reassuring as well, return to the ER if you feel like your symptoms are worsening

## 2024-03-21 ENCOUNTER — Emergency Department (HOSPITAL_BASED_OUTPATIENT_CLINIC_OR_DEPARTMENT_OTHER)

## 2024-03-21 ENCOUNTER — Encounter (HOSPITAL_BASED_OUTPATIENT_CLINIC_OR_DEPARTMENT_OTHER): Payer: Self-pay

## 2024-03-21 ENCOUNTER — Other Ambulatory Visit: Payer: Self-pay

## 2024-03-21 ENCOUNTER — Emergency Department (HOSPITAL_BASED_OUTPATIENT_CLINIC_OR_DEPARTMENT_OTHER)
Admission: EM | Admit: 2024-03-21 | Discharge: 2024-03-21 | Disposition: A | Discharge status: Home / Self Care | Attending: Emergency Medicine | Admitting: Emergency Medicine

## 2024-03-21 DIAGNOSIS — E119 Type 2 diabetes mellitus without complications: Secondary | ICD-10-CM | POA: Diagnosis not present

## 2024-03-21 DIAGNOSIS — Z79899 Other long term (current) drug therapy: Secondary | ICD-10-CM | POA: Diagnosis not present

## 2024-03-21 DIAGNOSIS — I10 Essential (primary) hypertension: Secondary | ICD-10-CM | POA: Insufficient documentation

## 2024-03-21 DIAGNOSIS — R519 Headache, unspecified: Secondary | ICD-10-CM | POA: Diagnosis present

## 2024-03-21 DIAGNOSIS — R079 Chest pain, unspecified: Secondary | ICD-10-CM

## 2024-03-21 LAB — BASIC METABOLIC PANEL WITH GFR
Anion gap: 9 (ref 5–15)
BUN: 15 mg/dL (ref 6–20)
CO2: 25 mmol/L (ref 22–32)
Calcium: 9.5 mg/dL (ref 8.9–10.3)
Chloride: 102 mmol/L (ref 98–111)
Creatinine, Ser: 0.81 mg/dL (ref 0.44–1.00)
GFR, Estimated: >60 mL/min (ref >60)
Glucose, Bld: 88 mg/dL (ref 70–99)
Potassium: 3.4 mmol/L — ABNORMAL LOW (ref 3.5–5.1)
Sodium: 136 mmol/L (ref 135–145)

## 2024-03-21 LAB — CBC
HCT: 37.5 % (ref 36.0–46.0)
Hemoglobin: 12.4 g/dL (ref 12.0–15.0)
MCH: 30.7 pg (ref 26.0–34.0)
MCHC: 33.1 g/dL (ref 30.0–36.0)
MCV: 92.8 fL (ref 80.0–100.0)
Platelets: 324 10*3/uL (ref 150–400)
RBC: 4.04 MIL/uL (ref 3.87–5.11)
RDW: 12.3 % (ref 11.5–15.5)
WBC: 6.8 10*3/uL (ref 4.0–10.5)
nRBC: 0 % (ref 0.0–0.2)

## 2024-03-21 LAB — CBG MONITORING, ED: Glucose-Capillary: 151 mg/dL — ABNORMAL HIGH (ref 70–99)

## 2024-03-21 LAB — TROPONIN I (HIGH SENSITIVITY)
Troponin I (High Sensitivity): 3 ng/L (ref <18)
Troponin I (High Sensitivity): 3 ng/L (ref <18)

## 2024-03-21 MED ORDER — MORPHINE SULFATE (PF) 4 MG/ML IV SOLN
4.0000 mg | Freq: Once | INTRAVENOUS | Status: AC
  Administered 2024-03-21: 4 mg via INTRAVENOUS
  Filled 2024-03-21: qty 1

## 2024-03-21 MED ORDER — PROCHLORPERAZINE EDISYLATE 10 MG/2ML IJ SOLN
10.0000 mg | Freq: Once | INTRAMUSCULAR | Status: AC
  Administered 2024-03-21: 10 mg via INTRAVENOUS
  Filled 2024-03-21: qty 2

## 2024-03-21 NOTE — Discharge Instructions (Addendum)
 The test today in the ED were reassuring.  No signs of heart injury.  The CAT scan of the brain was normal.  Follow-up with your primary care doctor to be rechecked.  Your blood pressure was also elevated today.  Continue your current medications and follow-up with your primary care doctor to have that rechecked

## 2024-03-21 NOTE — ED Notes (Signed)
 Patient transported to CT

## 2024-03-21 NOTE — ED Provider Notes (Signed)
 [place] EMERGENCY DEPARTMENT AT [facility] Provider Note   CSN: [id number] Arrival date & time: 03/21/24  4098     History  Chief Complaint  Patient presents with   Hypertension    [name] is a 53 y.o. female.   Hypertension     Patient has a history of hypertension acid reflux ovarian cyst diabetes who presents to the ED for evaluation of headache tingling hypertension.  Patient states she started having a bad headache earlier this morning.  This has been associated with numbness and tingling in both her hands and feet.  Patient feels like her legs bilaterally are heavy.  She denies any frequent history of headaches.  She denies any trouble with her speech.  She does feel like patients with livery.  Home Medications Prior to Admission medications   Medication Sig Start Date End Date Taking? Authorizing Provider  amLODipine (NORVASC) 10 MG tablet Take 10 mg by mouth daily. 04/30/20   [provider]  benzonatate (TESSALON) 100 MG capsule Take 1 capsule (100 mg total) by mouth 3 (three) times daily as needed for cough. 07/19/21   Curatolo, Adam, DO  carvedilol (COREG) 3.125 MG tablet Take 3.125 mg by mouth 2 (two) times daily. 10/07/23   [provider]  clotrimazole-betamethasone (LOTRISONE) cream Apply 1 Application topically 2 (two) times daily. 08/26/23   [provider]  diazepam (VALIUM) 5 MG tablet Take 1 tablet (5 mg total) by mouth 2 (two) times daily. 01/01/24   Glyn Ade, MD  diclofenac sodium (VOLTAREN) 1 % GEL Apply 4 g topically 4 (four) times daily. 06/27/19   Gwyneth Sprout, MD  esomeprazole (NEXIUM) 40 MG capsule Take 40 mg by mouth daily. 09/28/23   [provider]  hydrochlorothiazide (HYDRODIURIL) 25 MG tablet TK 1 T PO QD 06/15/19   [provider]  hydrOXYzine (ATARAX/VISTARIL) 25 MG tablet  06/15/19   [provider]  ibuprofen (ADVIL,MOTRIN) 600 MG tablet Take 1 tablet (600 mg  total) by mouth every 6 (six) hours as needed. 03/12/18   Jacalyn Lefevre, MD  levothyroxine (SYNTHROID) 112 MCG tablet TK 2 TS PO QD 06/15/19   [provider]  lidocaine (LIDODERM) 5 % Place 1 patch onto the skin daily. Remove & Discard patch within 12 hours or as directed by MD 06/27/19   Gwyneth Sprout, MD  lisinopril (ZESTRIL) 10 MG tablet  06/20/19   [provider]  metFORMIN (GLUCOPHAGE) 1000 MG tablet  06/23/19   [provider]  naproxen (NAPROSYN) 500 MG tablet Take 1 tablet (500 mg total) by mouth 2 (two) times daily. 03/25/18   Petrucelli, Samantha R, PA-C  OZEMPIC, 0.25 OR 0.5 MG/DOSE, 2 MG/1.5ML SOPN INJ  0.25 MG DOSE  [place] ONCE A WK 06/23/19   [provider]  pantoprazole (PROTONIX) 20 MG tablet Take 1 tablet (20 mg total) by mouth daily. 06/29/23   Rolan Bucco, MD  promethazine (PHENERGAN) 12.5 MG tablet Take 1 tablet (12.5 mg total) by mouth every 8 (eight) hours as needed for up to 10 doses for nausea or vomiting. 07/19/21   Curatolo, Adam, DO  sucralfate (CARAFATE) 1 g tablet Take 1 tablet (1 g total) by mouth 4 (four) times daily -  with meals and at bedtime. 06/29/23   Rolan Bucco, MD  traZODone (DESYREL) 50 MG tablet Take 50 mg by mouth at bedtime. 02/24/20   [provider]  Vitamin D, Ergocalciferol, (DRISDOL) 1.25 MG (50000 UNIT) CAPS capsule Take  50,000 Units by mouth 2 (two) times a week. 07/31/23   [provider]  zolpidem (AMBIEN) 10 MG tablet  06/17/19   [provider]      Allergies    Metoclopramide, Ondansetron, Darvocet [propoxyphene n-acetaminophen], Hydrocodone-acetaminophen, and Zofran    Review of Systems   Review of Systems  Physical Exam Updated Vital Signs BP (!) 161/90   Pulse 83   Temp 97.7 F (36.5 C)   Resp 20   Ht 1.575 m (5\' 2" )   Wt 74.8 kg   SpO2 100%   BMI 30.18 kg/m  Physical Exam Vitals and nursing note reviewed.  Constitutional:      General: She is not in acute distress.     Appearance: She is well-developed.  HENT:     Head: Normocephalic and atraumatic.     Right Ear: External ear normal.     Left Ear: External ear normal.  Eyes:     General: No visual field deficit or scleral icterus.       Right eye: No discharge.        Left eye: No discharge.     Conjunctiva/sclera: Conjunctivae normal.  Neck:     Trachea: No tracheal deviation.  Cardiovascular:     Rate and Rhythm: Normal rate and regular rhythm.  Pulmonary:     Effort: Pulmonary effort is normal. No respiratory distress.     Breath sounds: Normal breath sounds. No stridor. No wheezing or rales.  Abdominal:     General: Bowel sounds are normal. There is no distension.     Palpations: Abdomen is soft.     Tenderness: There is no abdominal tenderness. There is no guarding or rebound.  Musculoskeletal:        General: No tenderness.     Cervical back: Neck supple.  Skin:    General: Skin is warm and dry.     Findings: No rash.  Neurological:     Mental Status: She is alert and oriented to person, place, and time.     Cranial Nerves: No cranial nerve deficit, dysarthria or facial asymmetry.     Sensory: No sensory deficit.     Motor: No abnormal muscle tone, seizure activity or pronator drift.     Coordination: Coordination normal.     Comments:  able to hold both legs off bed for 5 seconds, sensation intact in all extremities,  no left or right sided neglect, normal finger-nose exam bilaterally, no nystagmus noted   Psychiatric:        Mood and Affect: Mood normal.     ED Results / Procedures / Treatments   Labs (all labs ordered are listed, but only abnormal results are displayed) Labs Reviewed  BASIC METABOLIC PANEL WITH GFR - Abnormal; Notable for the following components:      Result Value   Potassium 3.4 (*)    All other components within normal limits  CBG MONITORING, ED - Abnormal; Notable for the following components:   Glucose-Capillary 151 (*)    All other components within  normal limits  CBC  TROPONIN I (HIGH SENSITIVITY)  TROPONIN I (HIGH SENSITIVITY)    EKG EKG Interpretation Date/Time:  Monday March 21 2024 09:22:23 EDT Ventricular Rate:  89 PR Interval:  168 QRS Duration:  76 QT Interval:  337 QTC Calculation: 410 R Axis:   26  Text Interpretation: Sinus rhythm Abnormal R-wave progression, early transition No significant change since last tracing Confirmed by Linwood Dibbles [phone]) on  03/21/2024 9:24:42 AM  Radiology DG Chest 2 View Result Date: 03/21/2024 CLINICAL DATA:  Chest pain, elevated blood pressure, and blurred vision EXAM: CHEST - 2 VIEW COMPARISON:  Chest radiograph dated 01/03/2024 FINDINGS: Normal lung volumes. No focal consolidations. No pleural effusion or pneumothorax. The heart size and mediastinal contours are within normal limits. No acute osseous abnormality. Right upper quadrant surgical clips. IMPRESSION: No active cardiopulmonary disease. Electronically Signed   By: Agustin Cree M.D.   On: 03/21/2024 11:11   CT Head Wo Contrast Result Date: 03/21/2024 CLINICAL DATA:  Headache, new onset EXAM: CT HEAD WITHOUT CONTRAST TECHNIQUE: Contiguous axial images were obtained from the base of the skull through the vertex without intravenous contrast. RADIATION DOSE REDUCTION: This exam was performed according to the departmental dose-optimization program which includes automated exposure control, adjustment of the mA and/or kV according to patient size and/or use of iterative reconstruction technique. COMPARISON:  07/11/2020 FINDINGS: Brain: No evidence of acute infarction, hemorrhage, hydrocephalus, extra-axial collection or mass lesion/mass effect. Vascular: No hyperdense vessel or unexpected calcification. Skull: Normal. Negative for fracture or focal lesion. Sinuses/Orbits: No acute finding. IMPRESSION: Negative head CT. Electronically Signed   By: Tiburcio Pea M.D.   On: 03/21/2024 10:07    Procedures Procedures    Medications Ordered in  ED Medications  morphine (PF) 4 MG/ML injection 4 mg (4 mg Intravenous Given 03/21/24 0916)  prochlorperazine (COMPAZINE) injection 10 mg (10 mg Intravenous Given 03/21/24 0915)    ED Course/ Medical Decision Making/ A&P Clinical Course as of 03/21/24 1258  Mon Mar 21, 2024  0915 Patient is now complaining of chest discomfort [JK]  1046 CBC metabolic panel are normal.  Initial troponin normal. [JK]  1046 Head CT without acute abnormalities [JK]  1156 DG Chest 2 View Chest x-ray without acute abnormalities [JK]  1257 Patient is feeling better. [JK]    Clinical Course User Index [JK] Linwood Dibbles, MD                                 Medical Decision Making Differential diagnosis includes but not limited to subarachnoid hemorrhage, cerebral hemorrhage, nonspecific headache, migraine disorder, hypertensive urgency, panic attack  Problems Addressed: Chest pain, unspecified type: acute illness or injury that poses a threat to life or bodily functions Hypertension, unspecified type: acute illness or injury that poses a threat to life or bodily functions Nonintractable headache, unspecified chronicity pattern, unspecified headache type: acute illness or injury that poses a threat to life or bodily functions  Amount and/or Complexity of Data Reviewed Labs: ordered. Decision-making details documented in ED Course. Radiology: ordered and independent interpretation performed. Decision-making details documented in ED Course.  Risk Prescription drug management.   Patient presented to the ED with complaints of headache paresthesias.  While she was here she then started complaining of chest discomfort.  Patient's ED workup is reassuring.  No findings to suggest acute cardiac injury.  I have low suspicion for ACS.  EKG and troponins were normal.  Patient's primary complaint was her headache and hypertension associated with her paresthesias.  Patient was having numbness bilaterally throughout her  body.  Findings were not suggestive of acute stroke.  Head CT does not show hemorrhage or other acute abnormality.  It is possible there could have been an anxiety stress reaction contributing to her symptoms.  Patient is feeling much better at this time.  Blood pressure has improved to have low  suspicion for hypertensive urgency.  Will have the patient continue her home medications.  Outpatient follow-up with PCP.        Final Clinical Impression(s) / ED Diagnoses Final diagnoses:  Hypertension, unspecified type  Nonintractable headache, unspecified chronicity pattern, unspecified headache type  Chest pain, unspecified type    Rx / DC Orders ED Discharge Orders     None         Linwood Dibbles, MD 03/21/24 1258

## 2024-03-21 NOTE — ED Triage Notes (Signed)
 Pt states that her BP has elevated since 3 am. States that her vision is blurry and her head hurts.

## 2024-03-21 NOTE — ED Notes (Signed)
 IV placed in pt arm. While trying to place IV, pt became very vocal that she was having some chest pain. Pt became worked up and began to hyperventilate. Pt husband now at bedside. EKG performed. Informed pt to focus on her breathing. EDP made aware.
